# Patient Record
Sex: Male | Born: 1965 | Race: White | Hispanic: No | Marital: Married | Smoking: Never smoker
Health system: Southern US, Community
[De-identification: ages and names within clinical notes are randomized; demographics above are authoritative.]

## PROBLEM LIST (undated history)

## (undated) DIAGNOSIS — K219 Gastro-esophageal reflux disease without esophagitis: Secondary | ICD-10-CM

## (undated) DIAGNOSIS — IMO0001 Reserved for inherently not codable concepts without codable children: Secondary | ICD-10-CM

## (undated) DIAGNOSIS — I10 Essential (primary) hypertension: Secondary | ICD-10-CM

## (undated) HISTORY — DX: Reserved for inherently not codable concepts without codable children: IMO0001

---

## 1987-07-13 HISTORY — PX: MANDIBLE FRACTURE SURGERY: SHX706

## 2003-07-13 HISTORY — PX: HAND SURGERY: SHX662

## 2009-06-23 ENCOUNTER — Encounter: Admission: RE | Admit: 2009-06-23 | Discharge: 2009-06-23 | Payer: Self-pay | Admitting: Family Medicine

## 2009-12-09 ENCOUNTER — Emergency Department (HOSPITAL_COMMUNITY): Admission: EM | Admit: 2009-12-09 | Discharge: 2009-12-10 | Payer: Self-pay | Admitting: Emergency Medicine

## 2010-09-28 LAB — BASIC METABOLIC PANEL
BUN: 20 mg/dL (ref 6–23)
Chloride: 104 mEq/L (ref 96–112)
GFR calc Af Amer: >60 mL/min (ref >60)
Potassium: 3.6 mEq/L (ref 3.5–5.1)

## 2010-09-28 LAB — URINALYSIS, ROUTINE W REFLEX MICROSCOPIC
Bilirubin Urine: NEGATIVE
Glucose, UA: NEGATIVE mg/dL
Nitrite: NEGATIVE
Protein, ur: NEGATIVE mg/dL
Specific Gravity, Urine: 1.021 (ref 1.005–1.030)
pH: 7 (ref 5.0–8.0)

## 2010-09-28 LAB — DIFFERENTIAL
Basophils Absolute: 0 10*3/uL (ref 0.0–0.1)
Basophils Relative: 0 % (ref 0–1)
Eosinophils Absolute: 0.1 10*3/uL (ref 0.0–0.7)
Lymphocytes Relative: 20 % (ref 12–46)
Neutro Abs: 8.5 10*3/uL — ABNORMAL HIGH (ref 1.7–7.7)

## 2010-09-28 LAB — CBC
HCT: 37.8 % — ABNORMAL LOW (ref 39.0–52.0)
MCV: 82.8 fL (ref 78.0–100.0)
RDW: 13.3 % (ref 11.5–15.5)
WBC: 11.8 10*3/uL — ABNORMAL HIGH (ref 4.0–10.5)

## 2012-11-27 ENCOUNTER — Other Ambulatory Visit: Payer: Self-pay | Admitting: Family Medicine

## 2012-11-27 DIAGNOSIS — M545 Low back pain: Secondary | ICD-10-CM

## 2012-11-29 ENCOUNTER — Other Ambulatory Visit: Payer: Self-pay

## 2014-01-02 ENCOUNTER — Encounter: Payer: Self-pay | Admitting: Cardiovascular Disease

## 2014-01-02 ENCOUNTER — Ambulatory Visit (INDEPENDENT_AMBULATORY_CARE_PROVIDER_SITE_OTHER): Payer: BC Managed Care – PPO | Admitting: Cardiovascular Disease

## 2014-01-02 VITALS — BP 130/80 | HR 65 | Ht 64.0 in | Wt 178.0 lb

## 2014-01-02 DIAGNOSIS — R030 Elevated blood-pressure reading, without diagnosis of hypertension: Secondary | ICD-10-CM

## 2014-01-02 DIAGNOSIS — R42 Dizziness and giddiness: Secondary | ICD-10-CM

## 2014-01-02 NOTE — Progress Notes (Signed)
Patient ID: Jason Marquez, male   DOB: Feb 24, 1966, 48 y.o.   MRN: [id number]     PATIENT PROFILE: Jason Marquez is a 48 y.o. male is referred through the courtesy of Dr. Redmond Pulling and [facility] for evaluation of recent blood pressure elevation and dizziness.   HPI:  Jason Marquez is a 48 y.o. male who denies any cardiac history.  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  Several weeks ago, he hit his head and did not require sutures.  There was no evidence for concussion.  Several weeks ago, he began to notice vertigo-like symptoms where he was lightheaded with the room spinning.  This would be triggered by times bending over or turning his head and relieved by staying still.  Apparently, his wife had taken his blood pressure around that time, and it was elevated at 140 100.  The following day he was evaluated at [facility], and his blood pressure was 132/98.  Laboratory reveals a BUN of 16, creatinine 1.3.  Potassium 4.2.  LFTs were normal.  TSH was normal at 1.8 to white blood count was minimally elevated at 11.7.  Hemoglobin 14.5, hematocrit 42.3.  Glucose was 101.  Socially, he has felt well and has been taking Antivert initially at a dose of 25 mg more recently has been taking 12.5 mg.  His wife has checked his blood pressure, these now have been normal in the 130/80 range.  He presents for evaluation.  He denies chest pain.  He denies shortness of breath.  He denies change in activity.  He has been working outside with the extreme heat of the recent weeks.  He has been trying to hydrate himself.  He denies PND, or orthopnea.  He denies episodes where his heart is racing.  History reviewed. No pertinent past medical history.  Past Surgical History  Procedure Laterality Date  . Hand surgery  2005    left  . Mandible fracture surgery  1989    Allergies  Allergen Reactions  . Naproxen     Current Outpatient Prescriptions  Medication  Sig Dispense Refill  . meclizine (ANTIVERT) 25 MG tablet Take 25 mg by mouth as needed.       No current facility-administered medications for this visit.    Socially, he is married for 25 years.  He works as a Engineer, building services for [employer].  There is no tobacco or alcohol use.  He does walk 3 days per week 30 minutes at a time.  He has 3 children and one grandchild.  Family History  Problem Relation Age of Onset  . Diabetes Mother   . Diabetes Maternal Grandmother   . Diabetes Maternal Grandfather   . Cancer Paternal Grandmother     ROS General: Negative; No fevers, chills, or night sweats HEENT: Negative; No changes in vision or hearing, sinus congestion, difficulty swallowing Pulmonary: Negative; No cough, wheezing, shortness of breath, hemoptysis Cardiovascular:  See HPI;  GI: Negative; No nausea, vomiting, diarrhea, or abdominal pain GU: Negative; No dysuria, hematuria, or difficulty voiding Musculoskeletal: Negative; no myalgias, joint pain, or weakness Hematologic/Oncologic: Negative; no easy bruising, bleeding Endocrine: Negative; no heat/cold intolerance; no diabetes Neuro: Negative; no changes in balance, headaches Skin: Negative; No rashes or skin lesions; He was  bitten by a tick over a month ago.  He denies any associated rash. Psychiatric: Negative; No behavioral problems, depression Sleep: Negative; No daytime sleepiness, hypersomnolence, bruxism, restless  legs, hypnogagnic hallucinations Other comprehensive 14 point system review is negative   Physical Exam BP 130/80  Pulse 65  Ht 5' 4"  (1.626 m)  Wt 178 lb (80.74 kg)  BMI 30.54 kg/m2 Repeat blood pressure by me was 128/86 supine and was 130/84 standing.  There was no orthostatic pulse rise. General: Alert, oriented, no distress.  Skin: normal turgor, no rashes, warm and dry HEENT: Normocephalic, atraumatic. Pupils equal round and reactive to light; sclera anicteric; extraocular muscles intact; Fundi  normal Nose without nasal septal hypertrophy Mouth/Parynx benign; Mallinpatti scale 2 Neck: No JVD, no carotid bruits; normal carotid upstroke Lungs: clear to ausculatation and percussion; no wheezing or rales Chest wall: without tenderness to palpitation Heart: PMI not displaced, RRR, s1 s2 normal, 1/6 systolic murmur, no diastolic murmur, no rubs, gallops, thrills, or heaves Abdomen: soft, nontender; no hepatosplenomehaly, BS+; abdominal aorta nontender and not dilated by palpation. Back: no CVA tenderness Pulses 2+ Musculoskeletal: full range of motion, normal strength, no joint deformities Extremities: no clubbing cyanosis or edema, Homan's sign negative  Neurologic: grossly nonfocal; Cranial nerves grossly wnl Psychologic: Normal mood and affect   ECG (independently read by me): Normal sinus rhythm at 65 beats per minute.  Borderline criteria for possible LVH in lead aVL.  Mild repolarization changes.  No ectopy.  Normal intervals.  LABS:  BMET    Component Value Date/Time   NA 136 12/10/2009 0107   K 3.6 12/10/2009 0107   CL 104 12/10/2009 0107   CO2 25 12/10/2009 0107   GLUCOSE 101* 12/10/2009 0107   BUN 20 12/10/2009 0107   CREATININE 1.02 12/10/2009 0107   CALCIUM 8.1* 12/10/2009 0107   GFRNONAA >60 12/10/2009 0107   GFRAA  Value: >60        The eGFR has been calculated using the MDRD equation. This calculation has not been validated in all clinical situations. eGFR's persistently <60 mL/min signify possible Chronic Kidney Disease. 12/10/2009 0107     Hepatic Function Panel  No results found for this basename: prot, albumin, ast, alt, alkphos, bilitot, bilidir, ibili     CBC    Component Value Date/Time   WBC 11.8* 12/10/2009 0107   RBC 4.56 12/10/2009 0107   HGB 13.1 12/10/2009 0107   HCT 37.8* 12/10/2009 0107   PLT 142* 12/10/2009 0107   MCV 82.8 12/10/2009 0107   MCHC 34.6 12/10/2009 0107   RDW 13.3 12/10/2009 0107   LYMPHSABS 2.4 12/10/2009 0107   MONOABS 0.8 12/10/2009 0107   EOSABS 0.1  12/10/2009 0107   BASOSABS 0.0 12/10/2009 0107     BNP No results found for this basename: probnp    Lipid Panel  No results found for this basename: chol, trig, hdl, cholhdl, vldl, ldlcalc      RADIOLOGY: No results found.   ASSESSMENT AND PLAN: Mr. Jamir Rone is a 48 year old gentleman who several weeks ago, was evaluated and was found to be mildly hypertensive.  The patient at that time was experiencing vertigo-like symptoms.  He denies any episodes of tachycardia.  He did experience dizziness with movement of his head and also had some associated nausea.  These symptoms have significantly improved since initiating Antivert.  His blood pressure today is stable and he does not have any findings to suggest orthostatic hypotension.  I suspect, is recent mild diastolic blood pressure elevation noted.  Several weeks ago, may be related to a combination of being dehydrated to the extreme heat of approximately 100 while working outside in  having these vertigo symptoms.  He does have borderline voltage criteria for LVH on his ECG in lead aVL.  I am scheduling him for a 2-D echo Doppler study to evaluate systolic and diastolic function, as well as for left ventricular hypertrophy, and valvular architecture.  I discussed with his wife and the plan to continue blood pressure recordings at home.  Presently, his blood pressure is stable.  I did review his blood work done at [facility].  At present, I do not feel he needs institution of blood pressure lowering medication.  However, she will be checking his blood pressure regularly at home and if his systolic blood pressure is 140 or above and diastolic blood pressure 90 or above.  I recommended a followup evaluation.  We will contact him  regarding his echo Doppler studies.  I will be available on an as needed basis pending upon his blood pressure recordings and echo Doppler.  Findings.  We discussed the importance of staying well hydrated particularly during  the heat of the summer and his outdoor physical work.   Troy Sine, MD, [facility] 01/02/2014 9:25 AM

## 2014-01-02 NOTE — Patient Instructions (Signed)
Your physician has requested that you have an echocardiogram. Echocardiography is a painless test that uses sound waves to create images of your heart. It provides your doctor with information about the size and shape of your heart and how well your heart's chambers and valves are working. This procedure takes approximately one hour. There are no restrictions for this procedure.  We will call you with the results.  Your physician recommends that you schedule a follow-up appointment in: AS NEEDED

## 2014-01-09 ENCOUNTER — Ambulatory Visit (HOSPITAL_COMMUNITY)
Admission: RE | Admit: 2014-01-09 | Discharge: 2014-01-09 | Disposition: A | Discharge status: Home / Self Care | Payer: BC Managed Care – PPO | Source: Ambulatory Visit | Attending: Cardiovascular Disease | Admitting: Cardiovascular Disease

## 2014-01-09 DIAGNOSIS — R42 Dizziness and giddiness: Secondary | ICD-10-CM

## 2014-01-09 DIAGNOSIS — I359 Nonrheumatic aortic valve disorder, unspecified: Secondary | ICD-10-CM | POA: Insufficient documentation

## 2014-01-09 NOTE — Progress Notes (Signed)
2D Echo Performed 01/09/2014    Keyanna Sandefer, RCS  

## 2014-08-13 ENCOUNTER — Other Ambulatory Visit (HOSPITAL_COMMUNITY): Payer: Self-pay | Admitting: Family Medicine

## 2014-08-13 DIAGNOSIS — R42 Dizziness and giddiness: Secondary | ICD-10-CM

## 2014-08-26 ENCOUNTER — Other Ambulatory Visit: Payer: Self-pay

## 2014-09-03 ENCOUNTER — Ambulatory Visit
Admission: RE | Admit: 2014-09-03 | Discharge: 2014-09-03 | Disposition: A | Discharge status: Home / Self Care | Payer: BLUE CROSS/BLUE SHIELD | Source: Ambulatory Visit | Attending: Family Medicine | Admitting: Family Medicine

## 2014-09-03 ENCOUNTER — Other Ambulatory Visit (HOSPITAL_COMMUNITY): Payer: Self-pay | Admitting: Family Medicine

## 2014-09-03 DIAGNOSIS — R42 Dizziness and giddiness: Secondary | ICD-10-CM

## 2014-09-12 ENCOUNTER — Ambulatory Visit
Admission: RE | Admit: 2014-09-12 | Discharge: 2014-09-12 | Disposition: A | Discharge status: Home / Self Care | Payer: BLUE CROSS/BLUE SHIELD | Source: Ambulatory Visit | Attending: Family Medicine | Admitting: Family Medicine

## 2014-09-12 ENCOUNTER — Other Ambulatory Visit: Payer: Self-pay

## 2014-09-12 ENCOUNTER — Other Ambulatory Visit: Payer: Self-pay | Admitting: Family Medicine

## 2014-09-12 DIAGNOSIS — R42 Dizziness and giddiness: Secondary | ICD-10-CM

## 2014-09-12 DIAGNOSIS — R1011 Right upper quadrant pain: Secondary | ICD-10-CM

## 2014-09-17 ENCOUNTER — Ambulatory Visit
Admission: RE | Admit: 2014-09-17 | Discharge: 2014-09-17 | Disposition: A | Discharge status: Home / Self Care | Payer: BLUE CROSS/BLUE SHIELD | Source: Ambulatory Visit | Attending: Family Medicine | Admitting: Family Medicine

## 2014-09-17 DIAGNOSIS — R1011 Right upper quadrant pain: Secondary | ICD-10-CM

## 2014-09-30 ENCOUNTER — Encounter: Payer: Self-pay | Admitting: Diagnostic Neuroimaging

## 2014-09-30 ENCOUNTER — Ambulatory Visit (INDEPENDENT_AMBULATORY_CARE_PROVIDER_SITE_OTHER): Payer: BLUE CROSS/BLUE SHIELD | Admitting: Diagnostic Neuroimaging

## 2014-09-30 VITALS — BP 134/92 | HR 70 | Ht 64.0 in | Wt 185.2 lb

## 2014-09-30 DIAGNOSIS — R42 Dizziness and giddiness: Secondary | ICD-10-CM

## 2014-09-30 NOTE — Progress Notes (Signed)
[facility]  PATIENT: Jason Marquez DOB: 1965-09-19  REFERRING CLINICIAN: Kelby Marquez HISTORY FROM: patient  REASON FOR VISIT: new consult    HISTORICAL  CHIEF COMPLAINT:  Chief Complaint  Patient presents with  . New Evaluation    had a head injury about a year ago, been experiencing dizziness a few times a week    HISTORY OF PRESENT ILLNESS:   49 year old male here for evaluation of dizziness. June 2015 patient was working under some machinery, when he stood up and hit his head against a metal door. He had a small laceration on his scalp which was treated with Steri-Strips. No significant headache or dizziness at the time of injury. One week later he was bending down under some equipment, then had onset of spinning sensation. Symptoms lasted 10-20 seconds. No nausea or vomiting. No ringing in ears or hearing loss. Since that time symptoms have improved, but are persistent. He is having 2-3 episodes per week, sometimes 2 per day, similar spinning, movement, delayed sensation if he moves quickly. Patient had MRI of the brain which showed nonspecific T2 hyperintensities. Patient referred to me for further evaluation.  Patient has some mild intermittent headaches throughout his life. He has mild hypercholesterolemia which is being managed with diet and exercise. He smokes cigarettes recently when he was a teenager. Patient exercises at home 2-3 times per week.  Patient had a concussion, 20 years ago, where he stood up and hit his head on an object above. At that time he had significant dizziness, confusion, nausea, and was diagnosed with concussion.   REVIEW OF SYSTEMS: Full 14 system review of systems performed and notable only for headache dizziness decreased energy.  ALLERGIES: Allergies  Allergen Reactions  . Naproxen     HOME MEDICATIONS: Outpatient Prescriptions Prior to Visit  Medication Sig Dispense Refill  . meclizine (ANTIVERT) 25 MG tablet Take 25 mg  by mouth as needed.     No facility-administered medications prior to visit.    PAST MEDICAL HISTORY: Past Medical History  Diagnosis Date  . Healthy adult     PAST SURGICAL HISTORY: Past Surgical History  Procedure Laterality Date  . Hand surgery  2005    left  . Mandible fracture surgery  1989    FAMILY HISTORY: Family History  Problem Relation Age of Onset  . Diabetes Mother   . Diabetes Maternal Grandmother   . Diabetes Maternal Grandfather   . Cancer Paternal Grandmother     SOCIAL HISTORY:  History   Social History  . Marital Status: Married    Spouse Name: Jason Marquez  . Number of Children: 2  . Years of Education: 12   Occupational History  . Not on file.   Social History Main Topics  . Smoking status: Never Smoker   . Smokeless tobacco: Not on file  . Alcohol Use: Yes     Comment: occas.  . Drug Use: No  . Sexual Activity: Not on file   Other Topics Concern  . Not on file   Social History Narrative   Lives with wife and 2 sons   Right handed    Drinks caffeine: once in a while, mostly tea      PHYSICAL EXAM  Filed Vitals:   09/30/14 0906  BP: 134/92  Pulse: 70  Height: 5\' 4"  (1.626 m)  Weight: 185 lb 3.2 oz (84.006 kg)    Body mass index is 31.77 kg/(m^2).   Visual Acuity Screening  Right eye Left eye Both eyes  Without correction: 20/50 20/30   With correction:       No flowsheet data found.  GENERAL EXAM: Patient is in no distress; well developed, nourished and groomed; neck is supple; DIX HALLPIKE AT SLOW SPEED, NEGATIVE. WITH FASTER MOVEMENTS, PT FEELS SUBJECTIVE DELAY IN MOVEMENT SENSATION VS ACTUAL BODY MOVEMENT. NO NYSTAGMUS OR NAUSEA.  CARDIOVASCULAR: Regular rate and rhythm, no murmurs, no carotid bruits  NEUROLOGIC: MENTAL STATUS: awake, alert, oriented to person, place and time, recent and remote memory intact, normal attention and concentration, language fluent, comprehension intact, naming intact, fund of  knowledge appropriate CRANIAL NERVE: no papilledema on fundoscopic exam, pupils equal and reactive to light, visual fields full to confrontation, extraocular muscles intact, no nystagmus, facial sensation and strength symmetric, hearing intact, palate elevates symmetrically, uvula midline, shoulder shrug symmetric, tongue midline. MOTOR: normal bulk and tone, full strength in the BUE, BLE SENSORY: normal and symmetric to light touch, pinprick, temperature, vibration  COORDINATION: finger-nose-finger, fine finger movements normal REFLEXES: deep tendon reflexes present and symmetric GAIT/STATION: narrow based gait; able to walk on toes, heels and tandem; romberg is negative    DIAGNOSTIC DATA (LABS, IMAGING, TESTING) - I reviewed patient records, labs, notes, testing and imaging myself where available.  Lab Results  Component Value Date   WBC 11.8* 12/10/2009   HGB 13.1 12/10/2009   HCT 37.8* 12/10/2009   MCV 82.8 12/10/2009   PLT 142* 12/10/2009      Component Value Date/Time   NA 136 12/10/2009 0107   K 3.6 12/10/2009 0107   CL 104 12/10/2009 0107   CO2 25 12/10/2009 0107   GLUCOSE 101* 12/10/2009 0107   BUN 20 12/10/2009 0107   CREATININE 1.02 12/10/2009 0107   CALCIUM 8.1* 12/10/2009 0107   GFRNONAA >60 12/10/2009 0107   GFRAA  12/10/2009 0107    >60        The eGFR has been calculated using the MDRD equation. This calculation has not been validated in all clinical situations. eGFR's persistently <60 mL/min signify possible Chronic Kidney Disease.   No results found for: CHOL, HDL, LDLCALC, LDLDIRECT, TRIG, CHOLHDL No results found for: HGBA1C No results found for: VITAMINB12 No results found for: TSH   09/12/14 MRI brain - Mild subcortical and periventricular T2 and FLAIR hyperintensities, likely chronic microvascular ischemic change. No acute intracranial findings or posttraumatic sequelae. [I reviewed images myself. 2 small subcortical foci of non-specific gliosis,  not likely related to pts symptoms; may be due to chronic small vessel ischemic disease as pt has mild hypercholesterolemia, int headaches, possible migraine, and remote smoking. -VRP]    ASSESSMENT AND PLAN  49 y.o. year old male here with intermittent dizziness / vertigo sensation triggered by fast head/body movements, since mild head injury in June 2015. Symptoms persistent, but slightly better than at onset. Will try vestibular PT for evaluation. MRI shows non-specific findings and I recommend observation.   Dx: peripheral vestibulopathy / post-concussion syndrome  PLAN: - try vestibular PT - monitor symptoms - reviewed importance of nutrition, hydration, exercise  Orders Placed This Encounter  Procedures  . PT vestibular rehab    Return in about 6 months (around 04/02/2015), or if symptoms worsen or fail to improve.    Penni Bombard, MD 3/70/4888, 9:16 AM Certified in Neurology, Neurophysiology and Neuroimaging  [facility] [address] [place], [place] [address] [phone]

## 2014-09-30 NOTE — Patient Instructions (Signed)
Try vestibular physical therapy evaluation.

## 2016-02-08 IMAGING — CR DG ORBITS FOR FOREIGN BODY
2 series · 2 of 2 positions shown · non-contrast
Comparison: None.

CLINICAL DATA: Metal working/exposure; clearance prior to MRI

EXAM:
ORBITS FOR FOREIGN BODY - 2 VIEW

[w waters (1 of 2)]
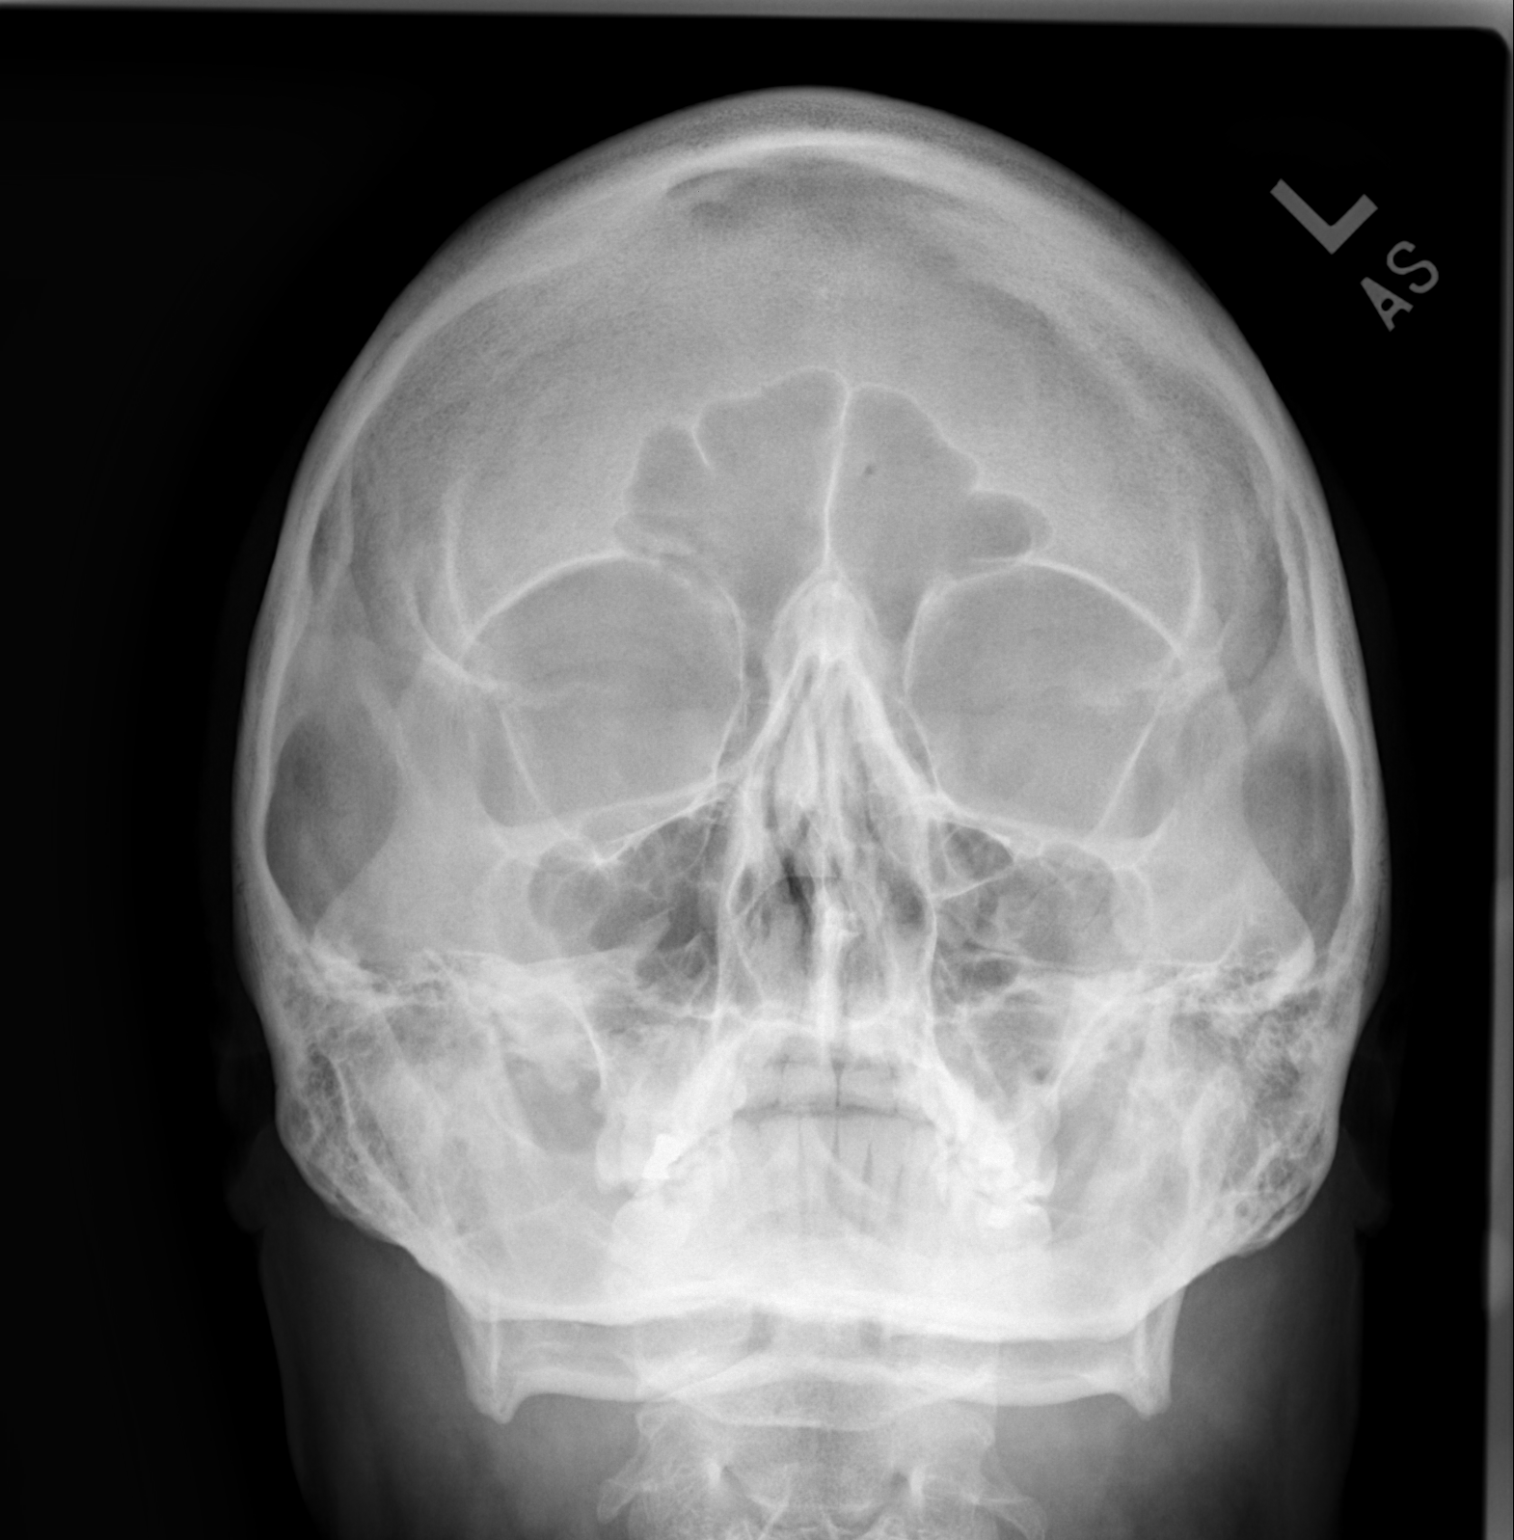

[w waters (2 of 2)]
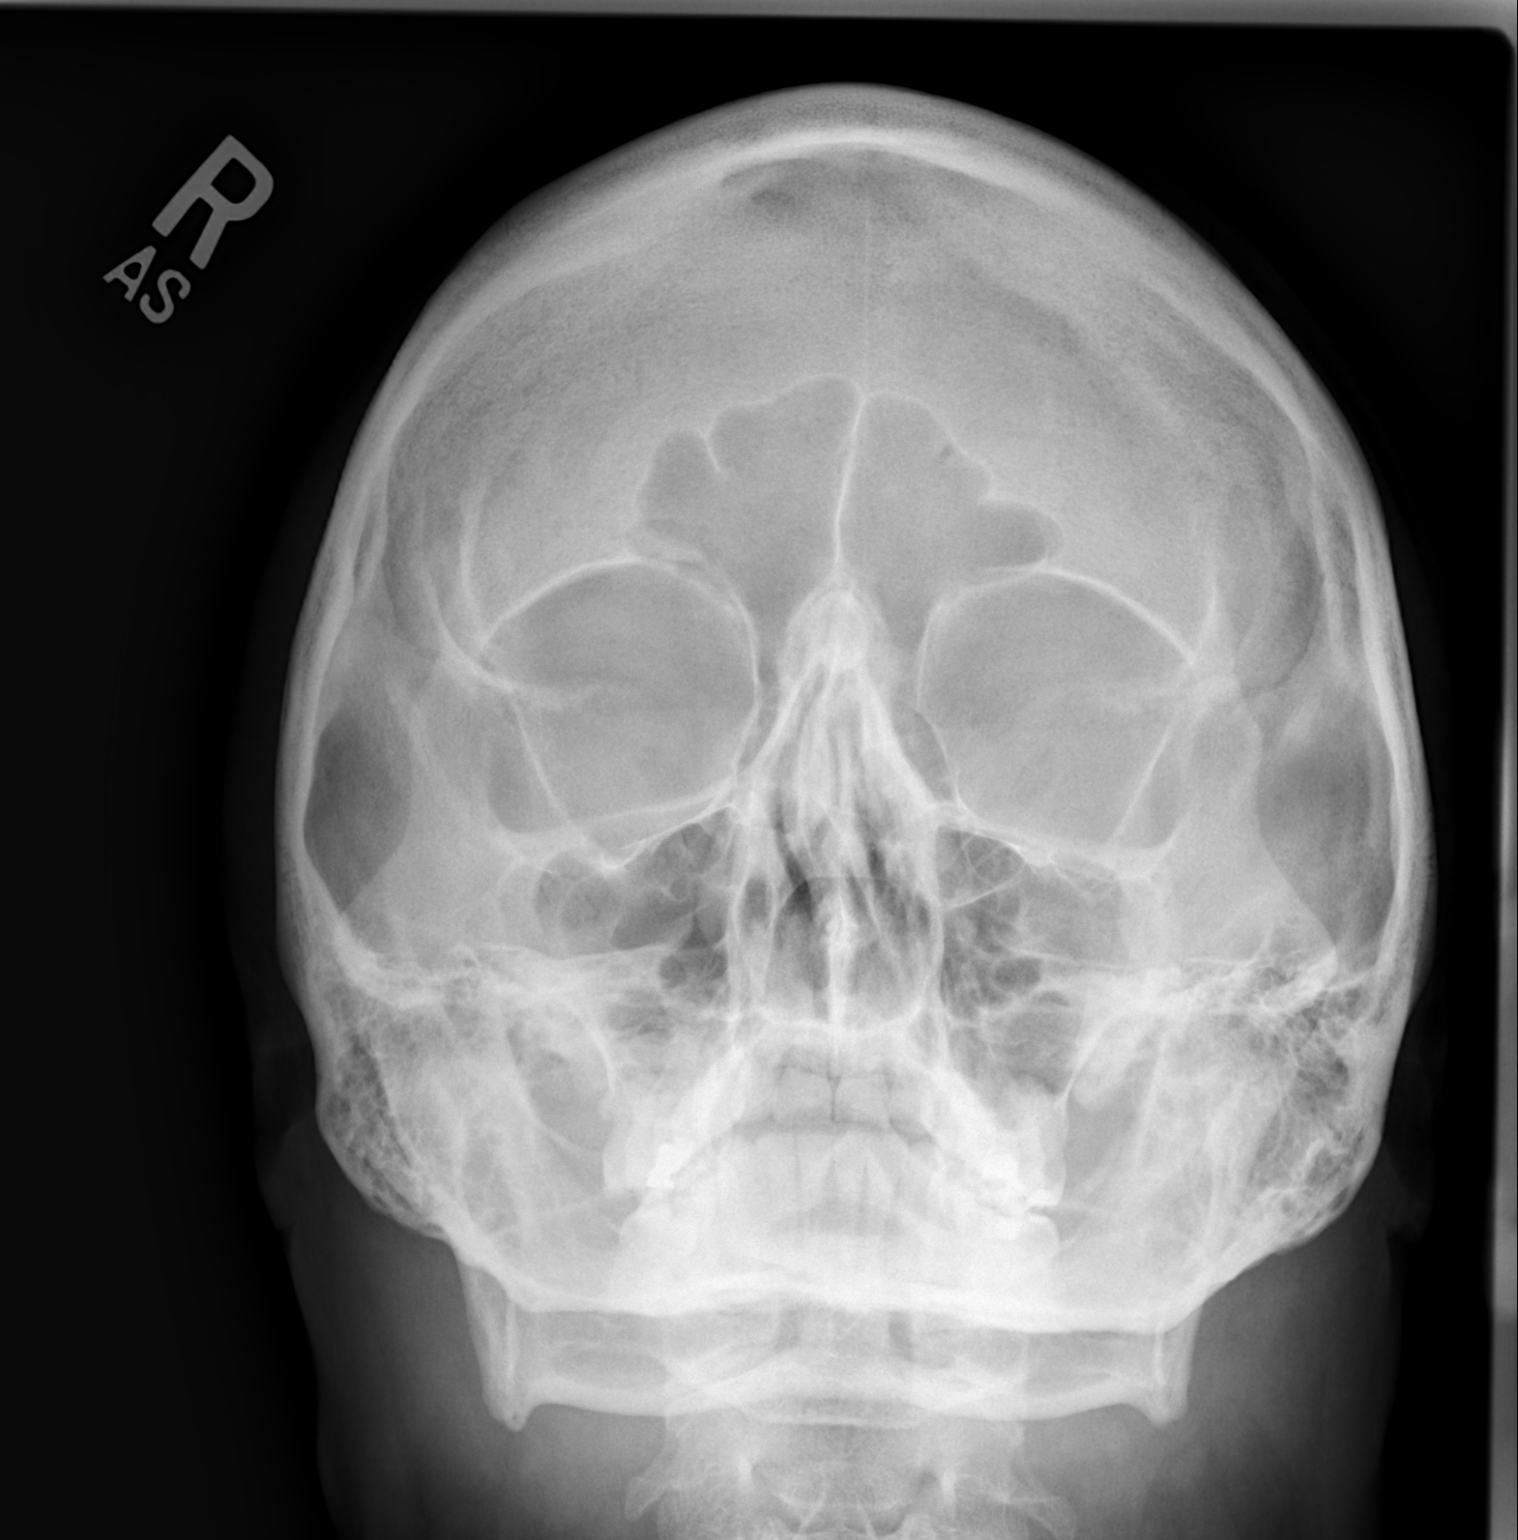

[2 of 2 positions shown; findings below may reference images not displayed]

FINDINGS: Water's views with eyes deviated toward the left and toward the
right were obtained. There is no radiopaque foreign body within the
orbits. No fracture or dislocation. Paranasal sinuses are clear.
IMPRESSION: No evidence of metallic foreign body within the orbits.

## 2016-02-08 IMAGING — US US CAROTID DUPLEX BILAT
1 series · 13 of 24 positions shown · non-contrast
Comparison: None.

CLINICAL DATA: Dizziness

EXAM:
BILATERAL CAROTID DUPLEX ULTRASOUND
TECHNIQUE: Gray scale imaging, color Doppler and duplex ultrasound were
performed of bilateral carotid and vertebral arteries in the neck.

[Series 1: us carotid duplex bilat · 0.07mm/px · 13 of 54 slices shown]
[im 1/54]
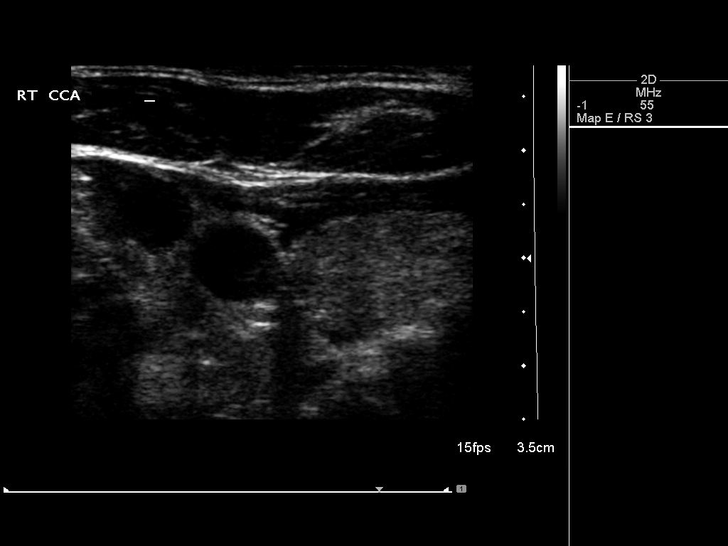
[im 5/54]
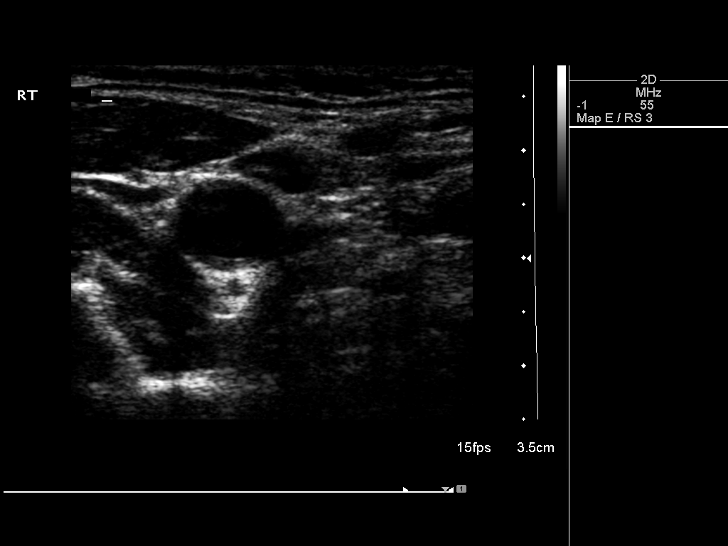
[im 10/54]
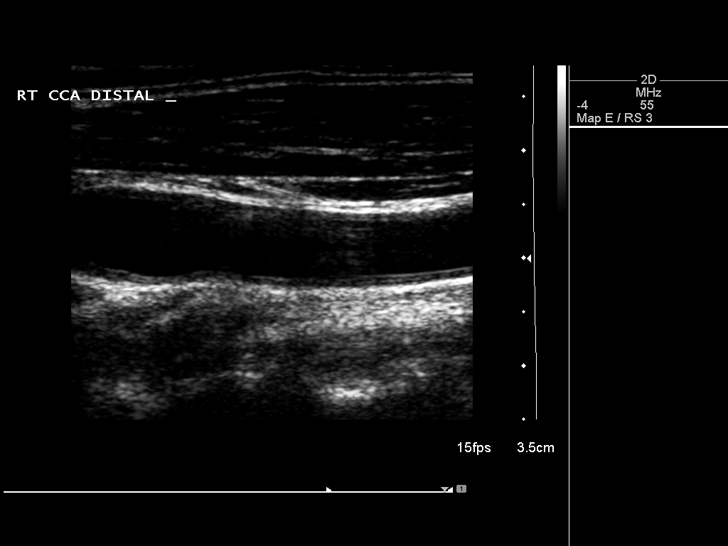
[im 14/54]
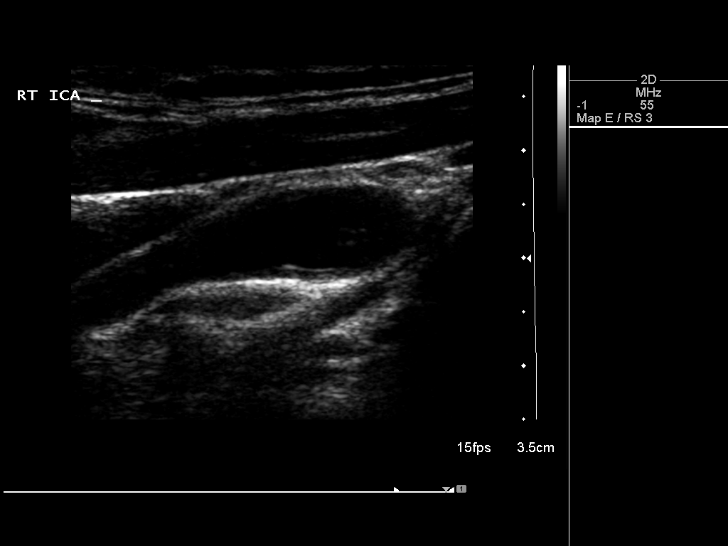
[im 19/54]
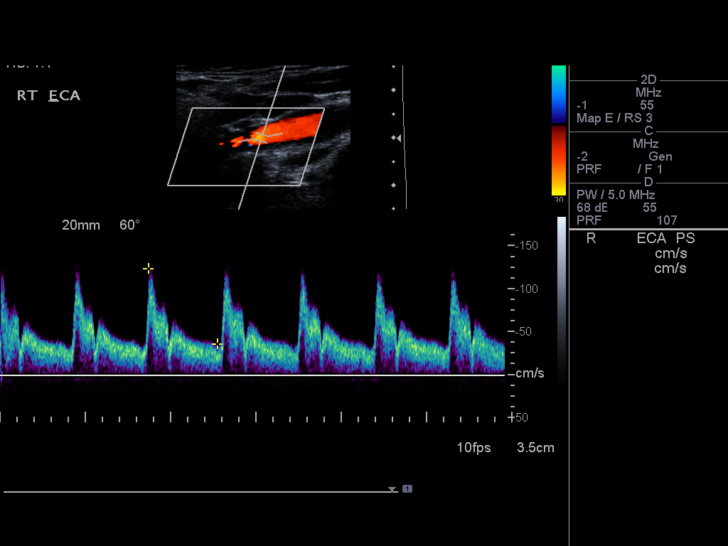
[im 24/54]
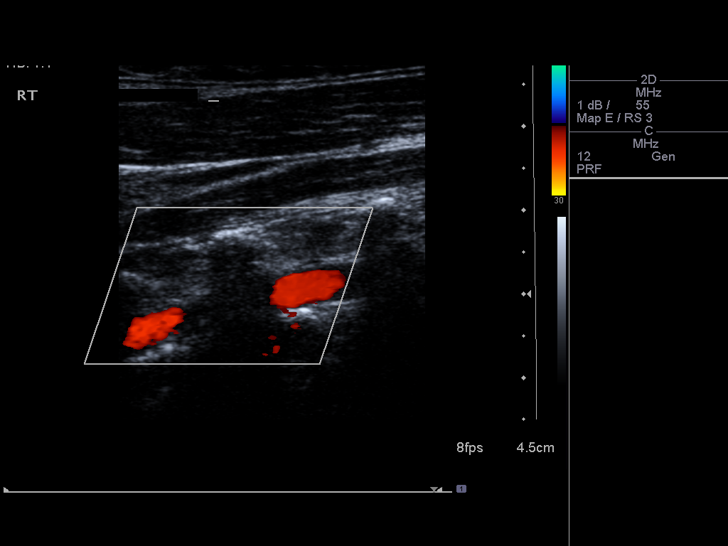
[im 28/54]
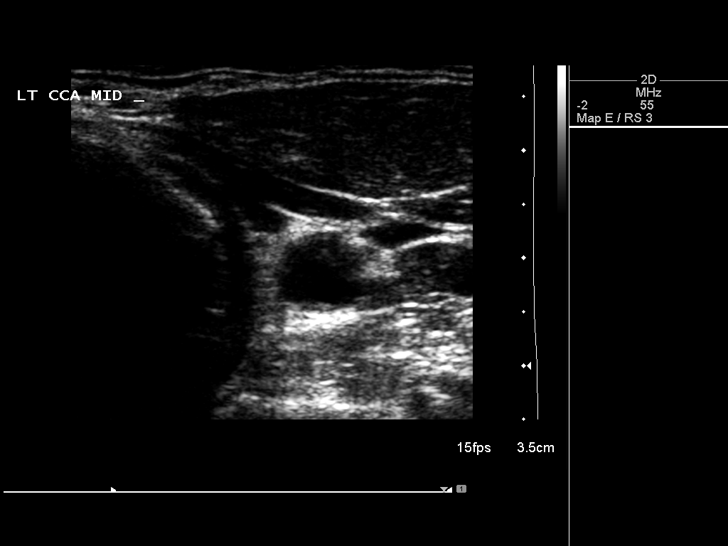
[im 30/54]
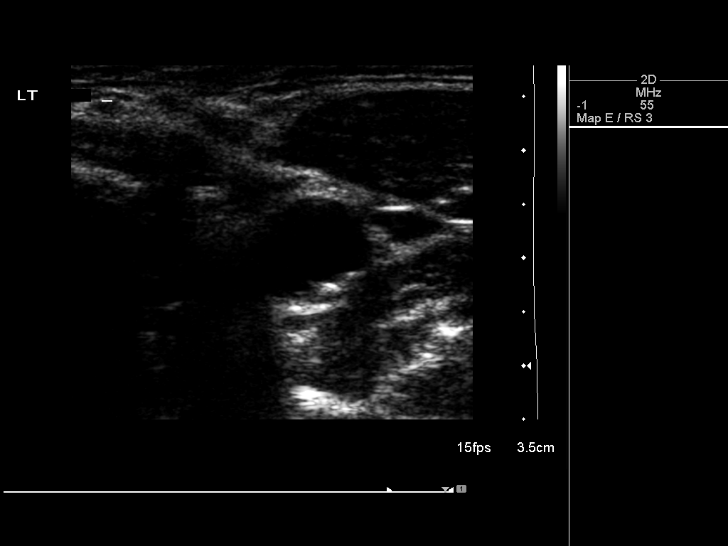
[im 35/54]
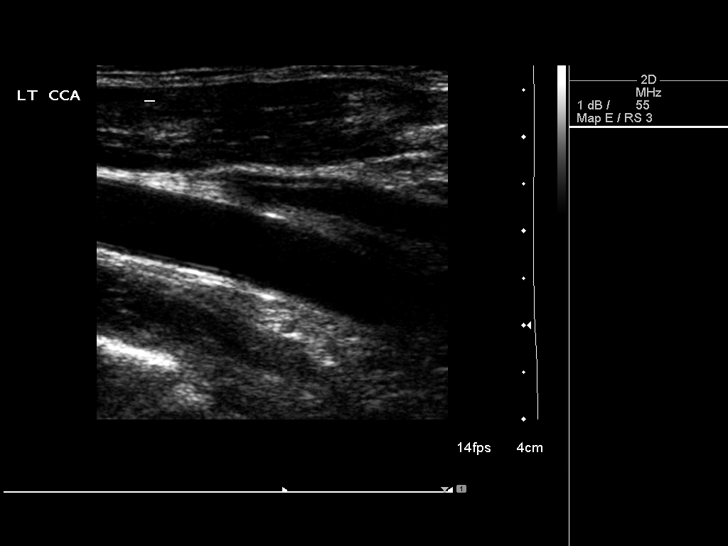
[im 40/54]
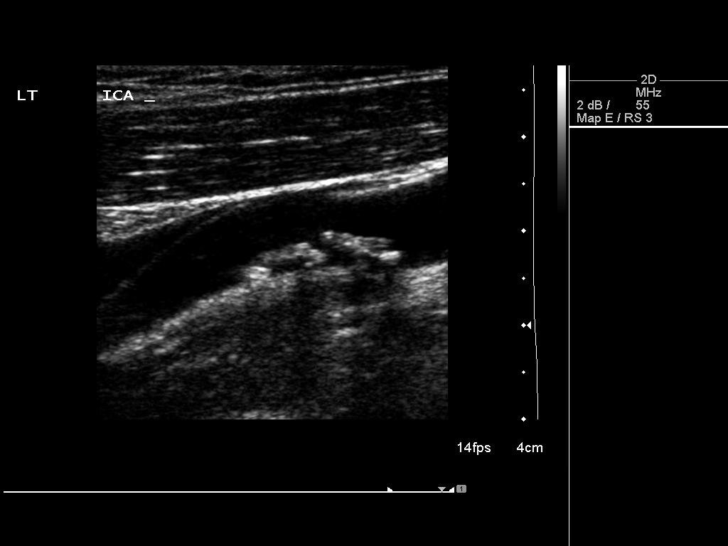
[im 44/54]
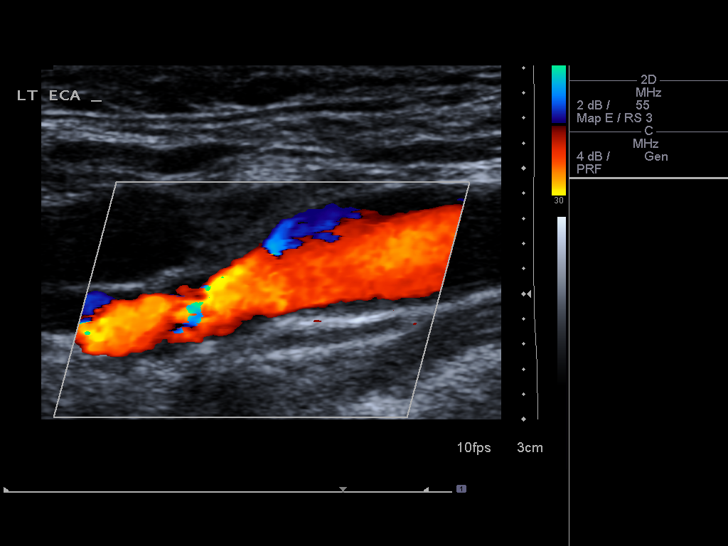
[im 49/54]
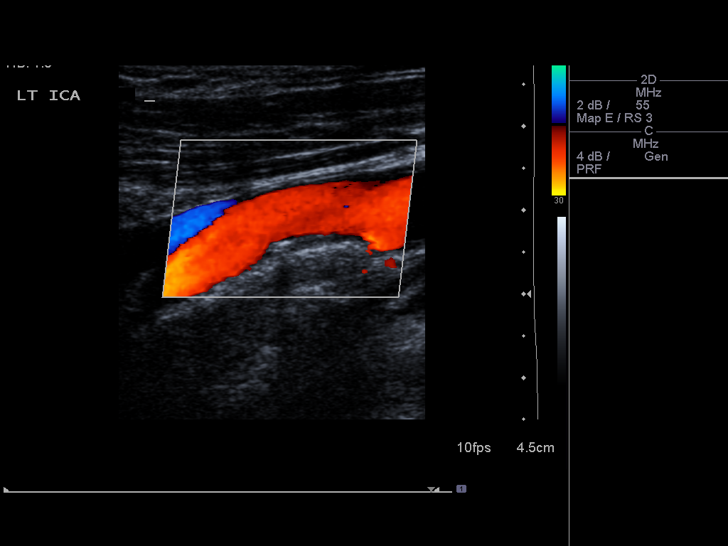
[im 54/54]
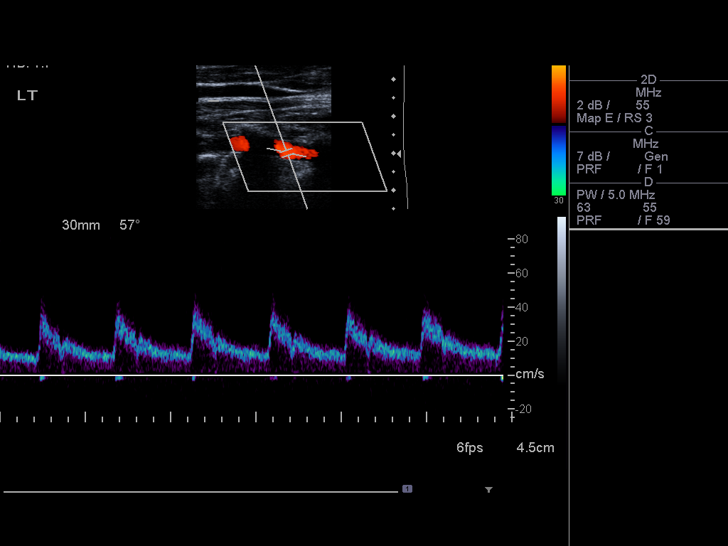

[13 of 24 positions shown; findings below may reference images not displayed]

FINDINGS: Criteria: Quantification of carotid stenosis is based on velocity
parameters that correlate the residual internal carotid diameter
with NASCET-based stenosis levels, using the diameter of the distal
internal carotid lumen as the denominator for stenosis measurement.

The following velocity measurements were obtained:

RIGHT

ICA:  81 cm/sec

CCA:  113 cm/sec

SYSTOLIC ICA/CCA RATIO:

DIASTOLIC ICA/CCA RATIO:

ECA:  124 cm/sec

LEFT

ICA:  88 cm/sec

CCA:  115 cm/sec

SYSTOLIC ICA/CCA RATIO:

DIASTOLIC ICA/CCA RATIO:

ECA:  112 cm/sec

RIGHT CAROTID ARTERY: Minimal intimal thickening in the bulb. Low
resistance internal carotid Doppler pattern.

RIGHT VERTEBRAL ARTERY:  Antegrade with a normal Doppler pattern.

LEFT CAROTID ARTERY: There is focal calcified plaque in the bulb
encroaching upon the lumen of the bulb. Low resistance internal
carotid Doppler pattern.

LEFT VERTEBRAL ARTERY:  Antegrade with a normal Doppler pattern.
IMPRESSION: Less than 50% stenosis in the right and left internal carotid
arteries. There is calcified plaque in the left bulb encroaching
upon the lumen.

## 2016-02-17 IMAGING — MR MR HEAD W/O CM
8 of 9 series · 33 of 48 positions shown · non-contrast
Comparison: None.

CLINICAL DATA: Dizziness beginning 3 months ago after blunt head
trauma. No loss of consciousness. Initial encounter.

EXAM:
MRI HEAD WITHOUT CONTRAST
TECHNIQUE: Multiplanar, multiecho pulse sequences of the brain and surrounding
structures were obtained without intravenous contrast.

[Series 2: T1 · sagittal · 5.0mm · 0.45mm/px · 2 of 19 slices shown]
[im 1/19]
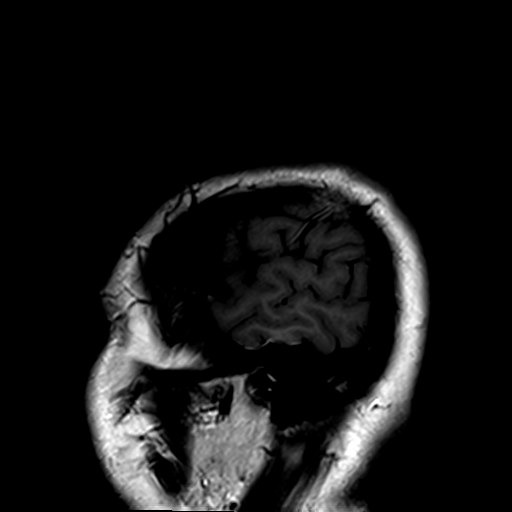
[im 19/19]
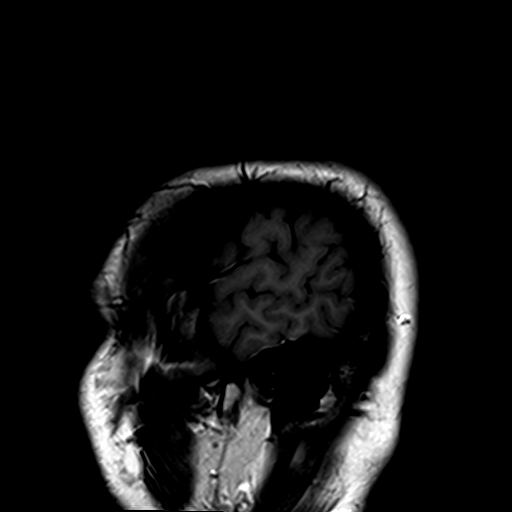

[Series 3: T2 · axial · 5.0mm · 0.45mm/px · z∈[-56,+78]mm · 3 of 22 slices shown (1 of 2)]
[im 1/22]
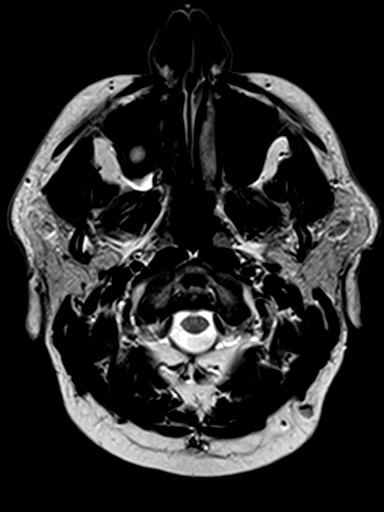
[im 11/22]
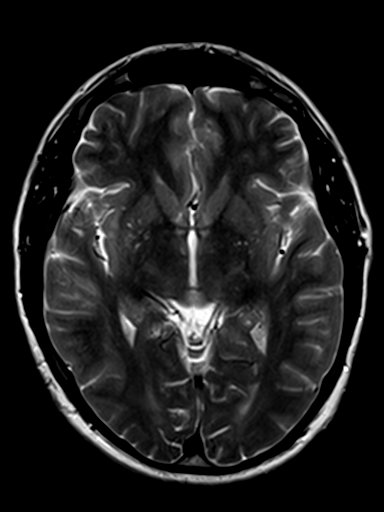
[im 22/22]
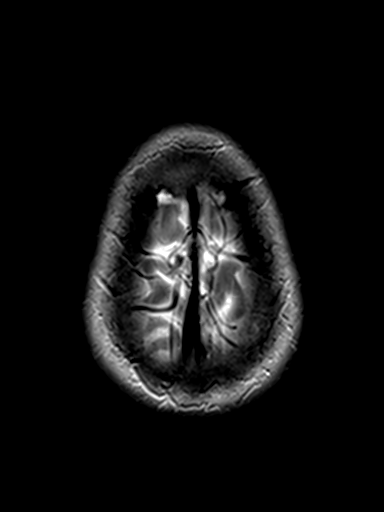

[Series 4: DWI · axial · 3.0mm · 0.94mm/px · z∈[-57,+81]mm · 11 of 96 slices shown]
[im 1/96]
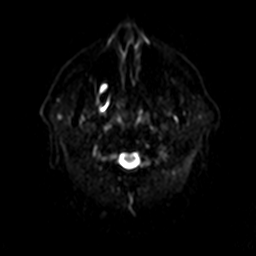
[im 10/96]
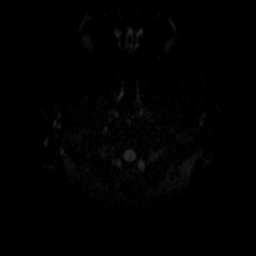
[im 20/96]
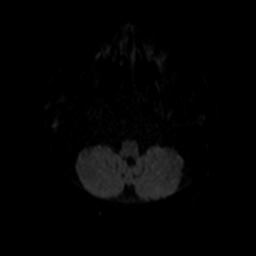
[im 29/96]
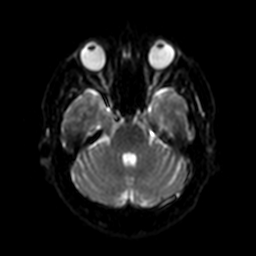
[im 39/96]
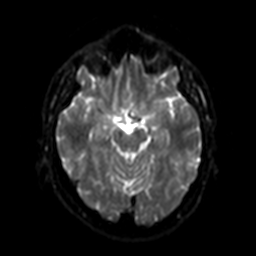
[im 48/96]
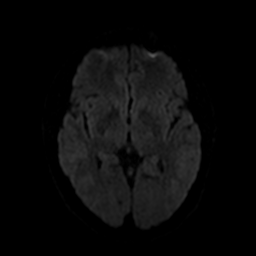
[im 58/96]
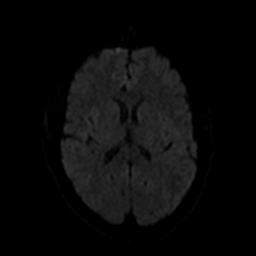
[im 67/96]
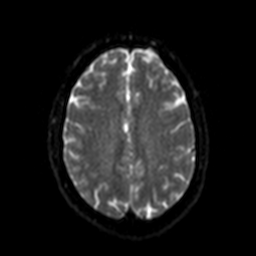
[im 77/96]
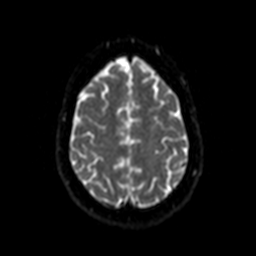
[im 86/96]
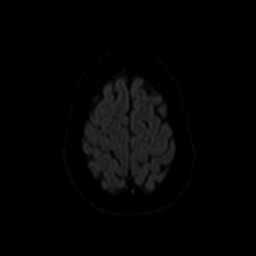
[im 96/96]
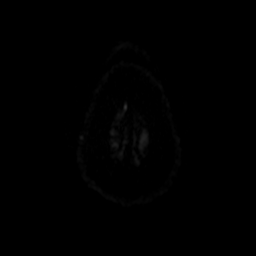

[Series 5: dwi_adc · axial · 3.0mm · 0.94mm/px · z∈[-57,+81]mm · 6 of 48 slices shown]
[im 1/48]
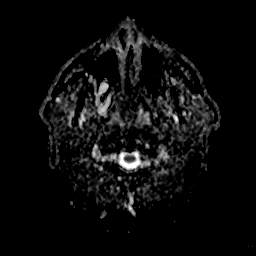
[im 10/48]
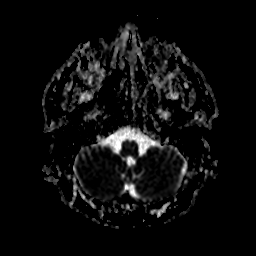
[im 19/48]
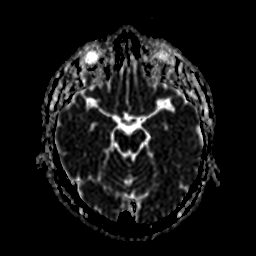
[im 29/48]
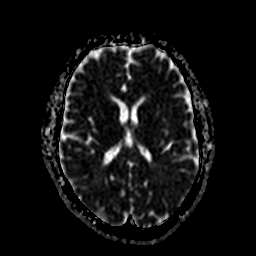
[im 38/48]
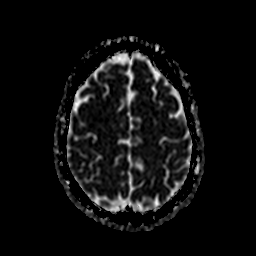
[im 48/48]
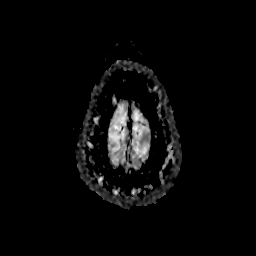

[Series 6: FLAIR · axial · 5.0mm · 0.45mm/px · z∈[-56,+78]mm · 3 of 22 slices shown (1 of 2)]
[im 1/22]
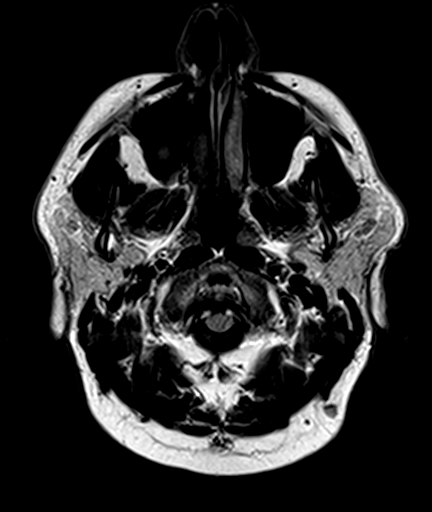
[im 11/22]
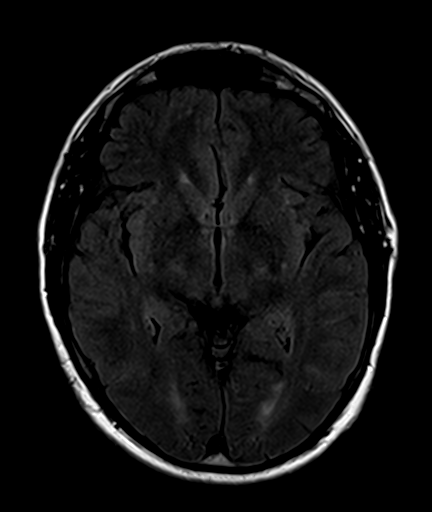
[im 22/22]
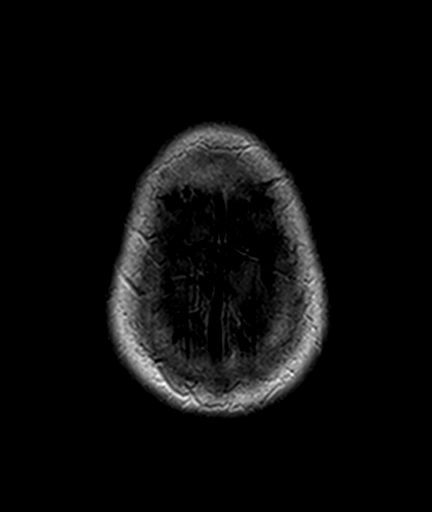

[Series 7: axial (person_name)1 volume · axial · 2.0mm · 0.45mm/px · z∈[-63,-26]mm · 3 of 80 slices shown]
[im 1/80]
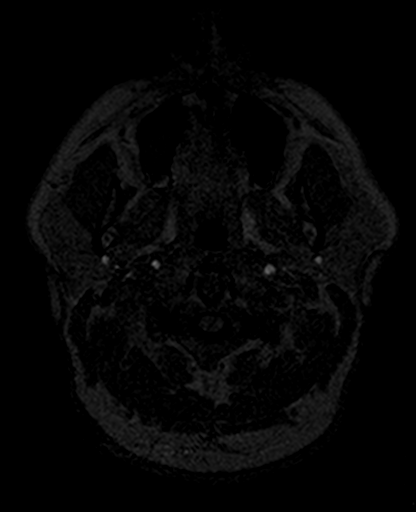
[im 10/80]
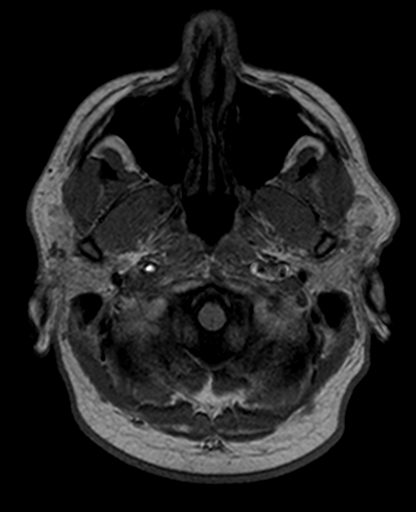
[im 20/80]
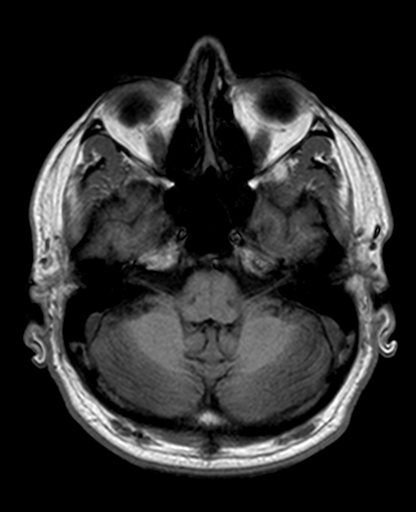

[Series 10: T2 · coronal · 5.0mm · 0.45mm/px · 3 of 26 slices shown (2 of 2)]
[im 1/26]
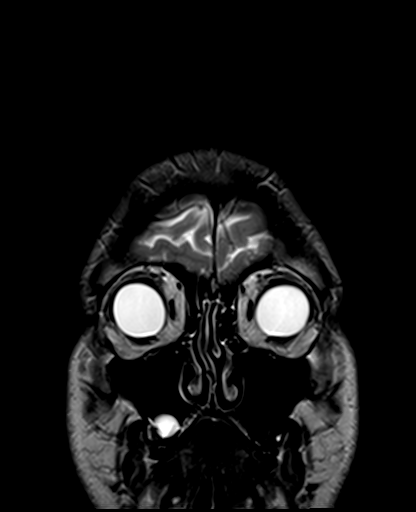
[im 13/26]
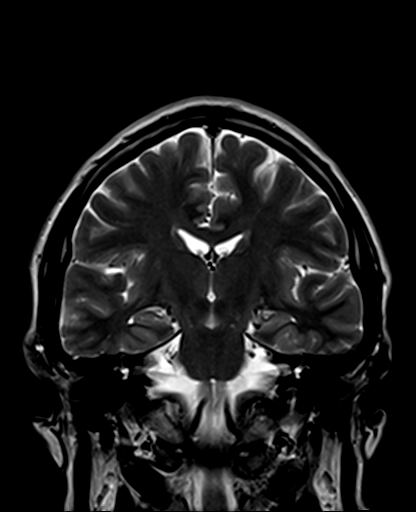
[im 26/26]
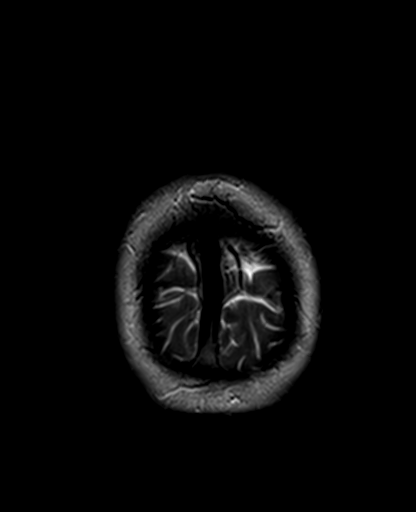

[Series 11: FLAIR · sagittal · 5.0mm · 0.45mm/px · 2 of 19 slices shown (2 of 2)]
[im 1/19]
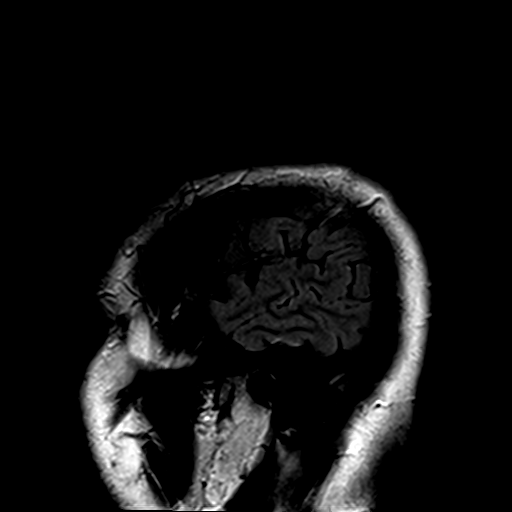
[im 19/19]
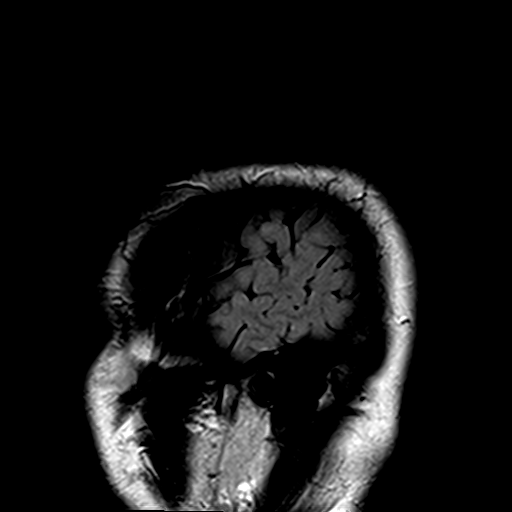

[33 of 48 positions shown; findings below may reference images not displayed]

FINDINGS: No evidence for acute infarction, hemorrhage, mass lesion,
hydrocephalus, or extra-axial fluid. Normal cerebral volume. Mild
subcortical and periventricular T2 and FLAIR hyperintensities,
likely chronic microvascular ischemic change.

Flow voids are maintained throughout the carotid, basilar, and
vertebral arteries. There are no areas of chronic hemorrhage.
Pituitary, pineal, and cerebellar tonsils unremarkable. No upper
cervical lesions.

Visualized calvarium, skull base, and upper cervical osseous
structures unremarkable. Scalp and extracranial soft tissues,
orbits, sinuses, and mastoids show no acute process.

No visible scalp hematoma or calvarial deformity. No evidence for
sequelae of occult craniocerebral trauma.
IMPRESSION: Mild subcortical and periventricular T2 and FLAIR hyperintensities,
likely chronic microvascular ischemic change.

No acute intracranial findings or posttraumatic sequelae.

## 2016-02-22 IMAGING — US US ABDOMEN LIMITED
1 series · 14 of 25 positions shown · non-contrast
Comparison: None.

CLINICAL DATA: Right upper quadrant pain for 2 months

EXAM:
US ABDOMEN LIMITED - RIGHT UPPER QUADRANT

[Series 1: us abdomen limited · 0.28mm/px · 14 of 42 slices shown]
[im 1/42]
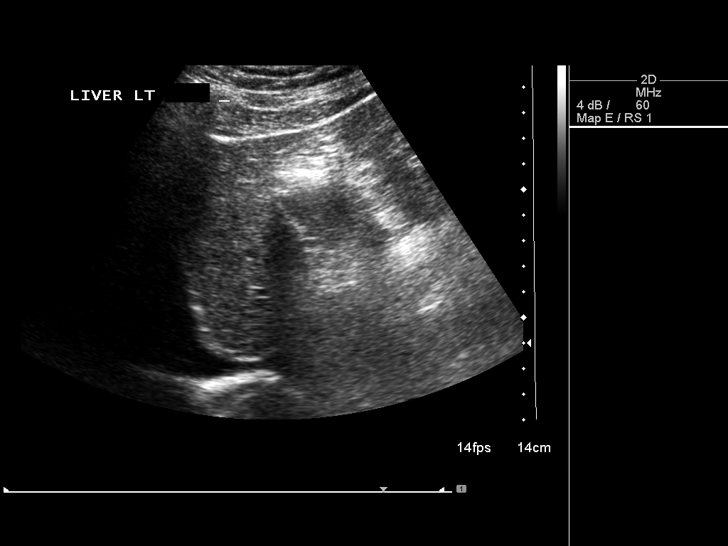
[im 4/42]
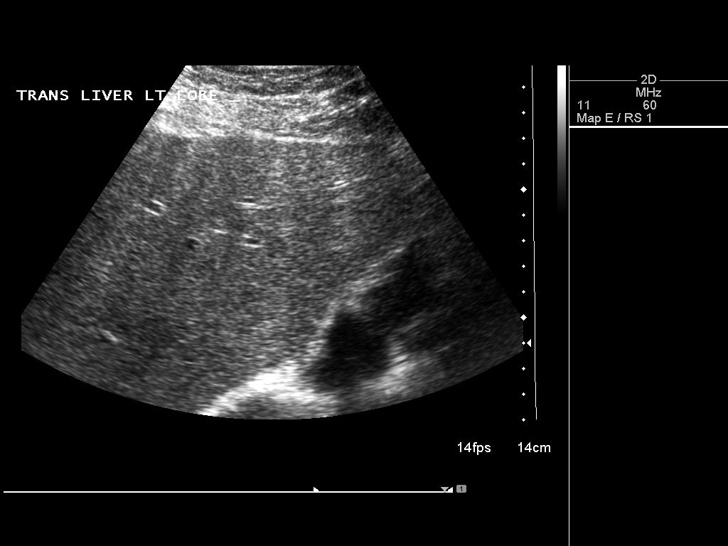
[im 7/42]
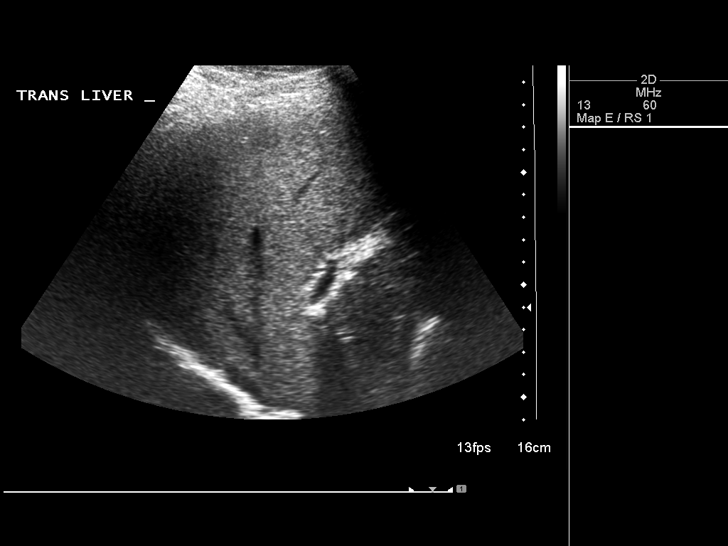
[im 11/42]
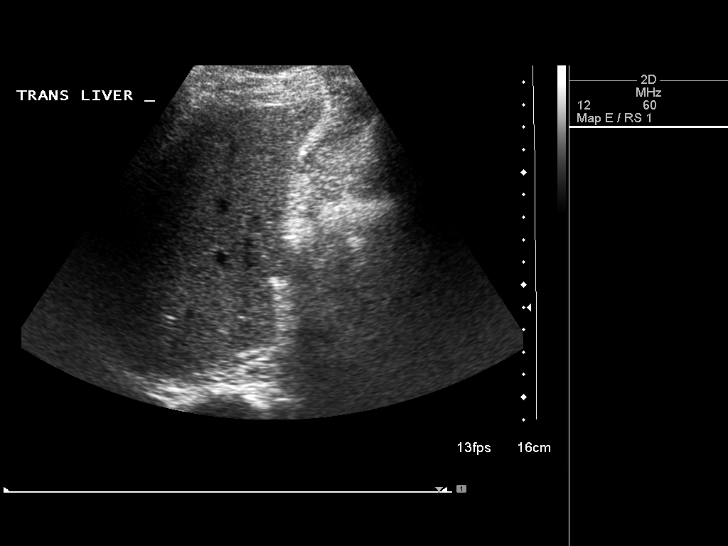
[im 14/42]
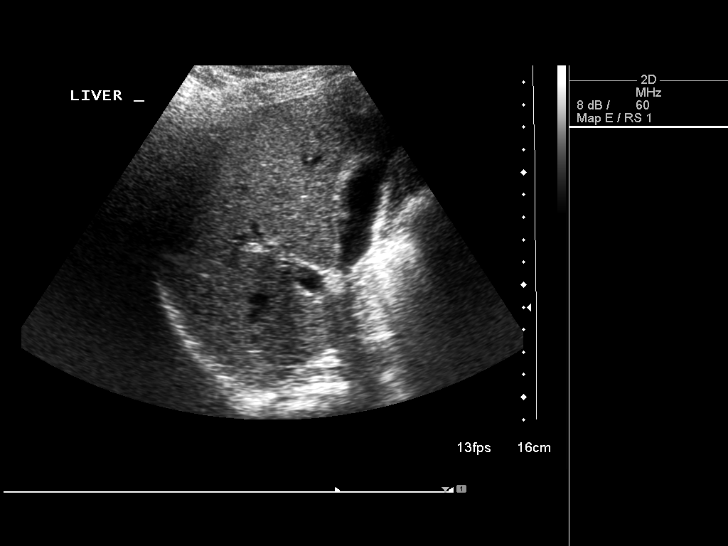
[im 16/42]
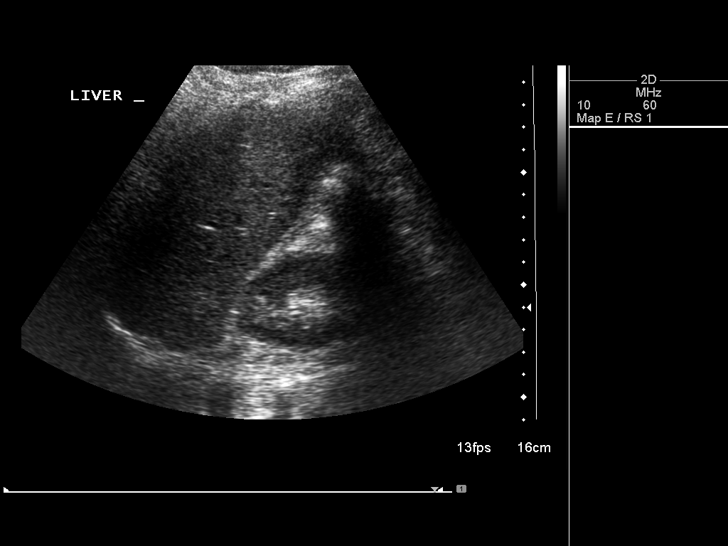
[im 19/42]
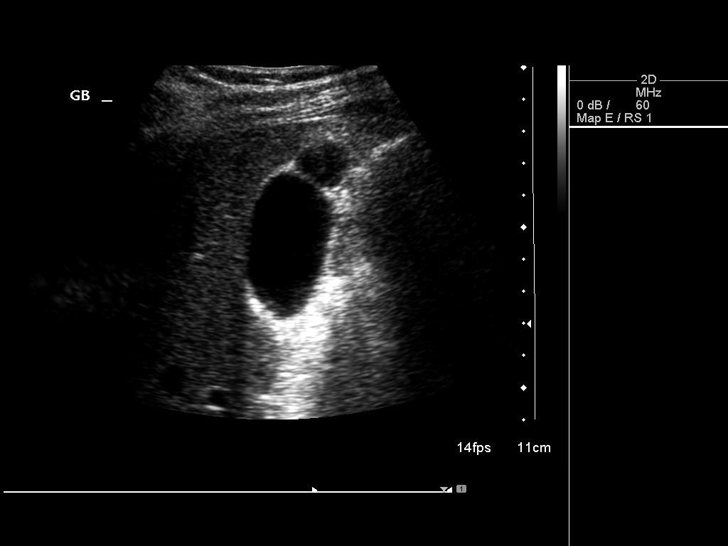
[im 23/42]
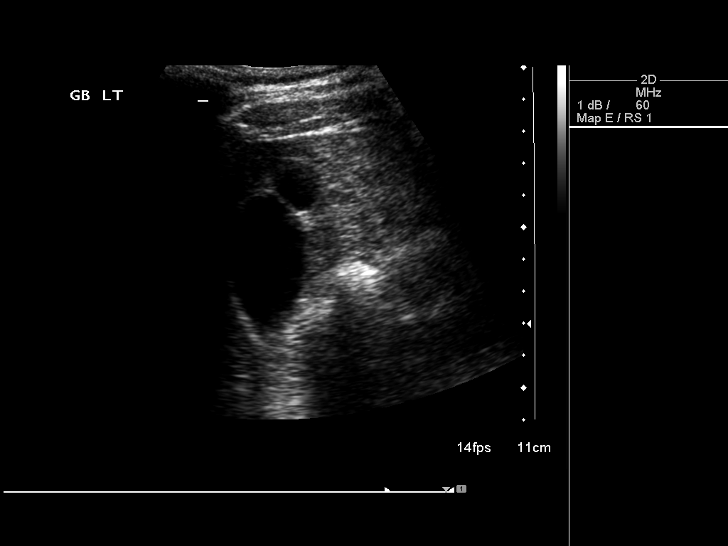
[im 26/42]
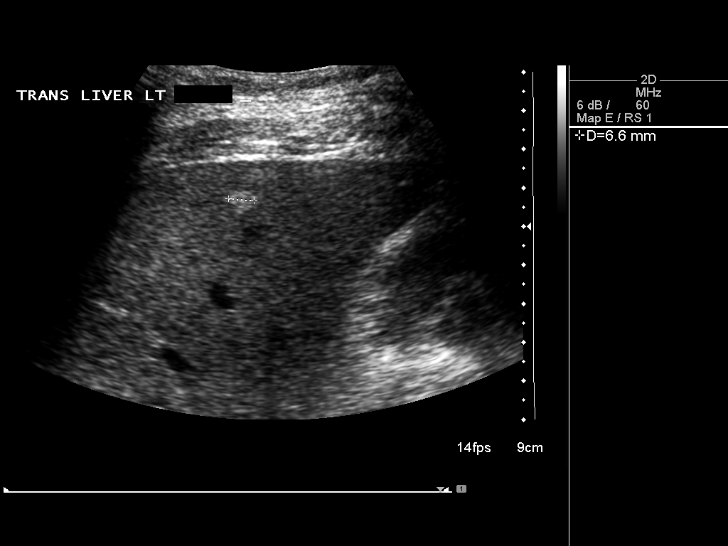
[im 28/42]
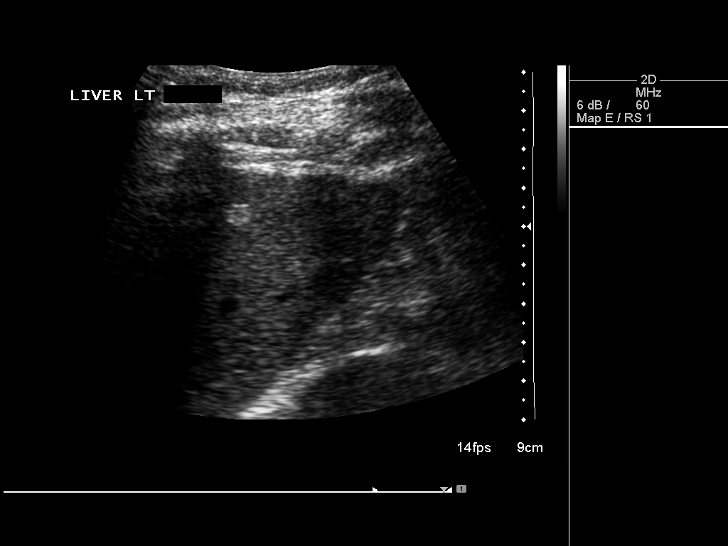
[im 31/42]
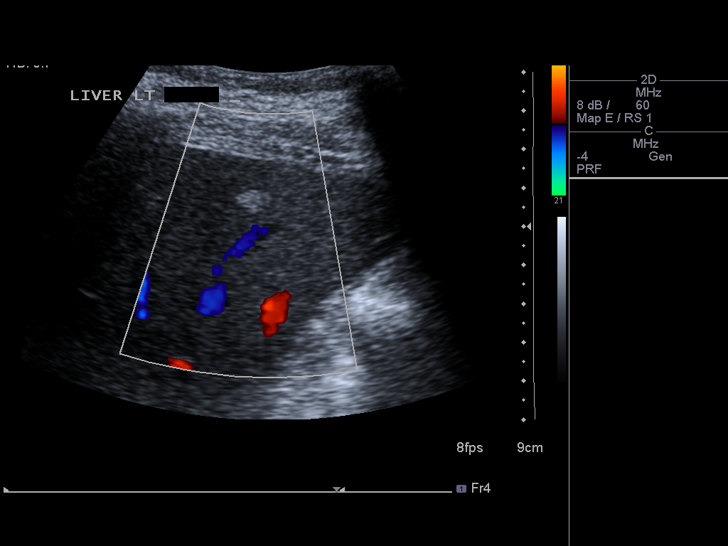
[im 35/42]
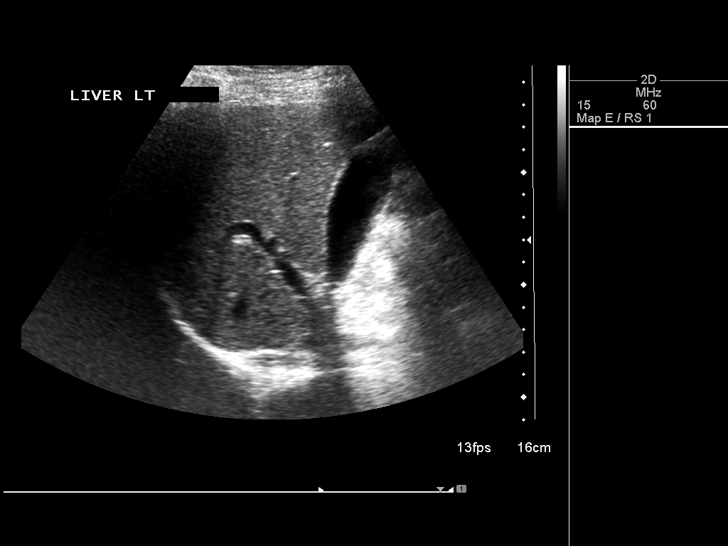
[im 38/42]
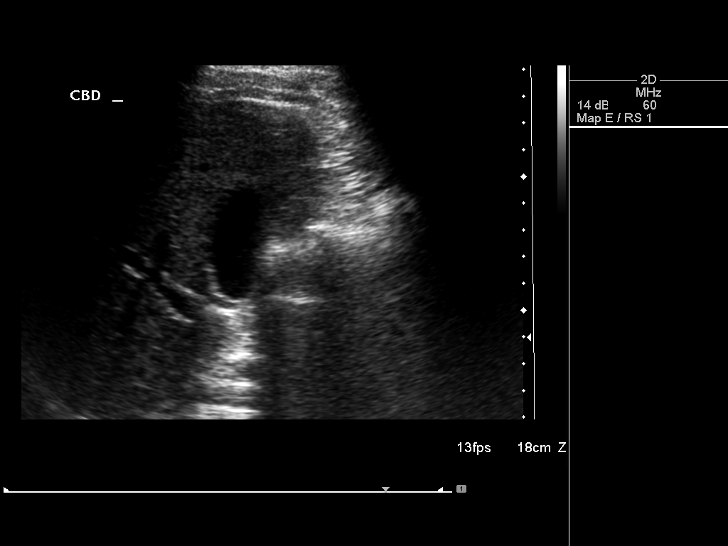
[im 42/42]
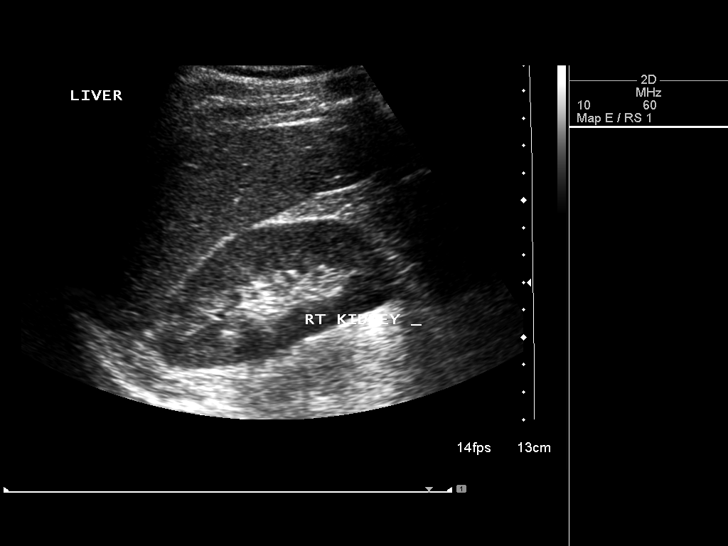

[14 of 25 positions shown; findings below may reference images not displayed]

FINDINGS: Gallbladder:

No gallstones or wall thickening visualized. No sonographic Murphy
sign noted.

Common bile duct:

Diameter: 3.2 mm in diameter within normal limits.

Liver:

The liver shows normal echogenicity. 7 x 6 mm hyperechoic lesion
anterior aspect of right hepatic lobe probable represents
hemangioma.
IMPRESSION: 1. No gallstones are noted within gallbladder.  Normal CBD.
2. Hyperechoic lesion in right hepatic lobe anteriorly measures 7 x
6 mm. Statistically this most likely represents a hemangioma.

## 2016-07-19 ENCOUNTER — Other Ambulatory Visit: Payer: Self-pay | Admitting: Gastroenterology

## 2016-07-19 DIAGNOSIS — R945 Abnormal results of liver function studies: Secondary | ICD-10-CM

## 2016-07-19 DIAGNOSIS — R7989 Other specified abnormal findings of blood chemistry: Secondary | ICD-10-CM

## 2016-07-21 ENCOUNTER — Ambulatory Visit
Admission: RE | Admit: 2016-07-21 | Discharge: 2016-07-21 | Disposition: A | Discharge status: Home / Self Care | Payer: BLUE CROSS/BLUE SHIELD | Source: Ambulatory Visit | Attending: Gastroenterology | Admitting: Gastroenterology

## 2016-07-21 DIAGNOSIS — R7989 Other specified abnormal findings of blood chemistry: Secondary | ICD-10-CM

## 2016-07-21 DIAGNOSIS — R945 Abnormal results of liver function studies: Secondary | ICD-10-CM

## 2016-08-16 ENCOUNTER — Encounter: Payer: Self-pay | Admitting: Allergy and Immunology

## 2016-08-16 ENCOUNTER — Ambulatory Visit (INDEPENDENT_AMBULATORY_CARE_PROVIDER_SITE_OTHER): Payer: BLUE CROSS/BLUE SHIELD | Admitting: Allergy and Immunology

## 2016-08-16 VITALS — BP 130/80 | HR 79 | Temp 97.8°F | Resp 20 | Ht 64.0 in | Wt 183.2 lb

## 2016-08-16 DIAGNOSIS — T7800XA Anaphylactic reaction due to unspecified food, initial encounter: Secondary | ICD-10-CM | POA: Diagnosis not present

## 2016-08-16 DIAGNOSIS — J3089 Other allergic rhinitis: Secondary | ICD-10-CM | POA: Diagnosis not present

## 2016-08-16 DIAGNOSIS — R062 Wheezing: Secondary | ICD-10-CM

## 2016-08-16 MED ORDER — EPINEPHRINE 0.3 MG/0.3ML IJ SOAJ: 0.3000 mg | Freq: Once | INTRAMUSCULAR | 2 refills | Status: AC

## 2016-08-16 MED ORDER — FLUTICASONE PROPIONATE 50 MCG/ACT NA SUSP: 2.0000 | Freq: Every day | NASAL | 5 refills | Status: DC

## 2016-08-16 NOTE — Assessment & Plan Note (Signed)
   Aeroallergen avoidance measures have been discussed and provided in written form.  A prescription has been provided for fluticasone nasal spray, 2 sprays per nostril daily as needed. Proper nasal spray technique has been discussed and demonstrated.  I have also recommended nasal saline spray (i.e., Simply Saline) or nasal saline lavage (i.e., NeilMed) as needed prior to medicated nasal sprays. 

## 2016-08-16 NOTE — Assessment & Plan Note (Signed)
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  For now, continue avoidance of cow's milk and egg as discussed.  A prescription has been provided for epinephrine auto-injector 2 pack along with instructions for proper administration.  A food allergy action plan has been provided and discussed.  Given the history, I believe that an open graded oral challenge would be appropriate if the patient is so inclined.

## 2016-08-16 NOTE — Patient Instructions (Addendum)
History of food allergy The patient has elevated serum specific IgE against cow's milk and egg in the context of a low probability history.  For now, continue avoidance of cow's milk and egg as discussed.  A prescription has been provided for epinephrine auto-injector 2 pack along with instructions for proper administration.  A food allergy action plan has been provided and discussed.  Given the history, I believe that an open graded oral challenge would be appropriate if the patient is so inclined.  Perennial and seasonal allergic rhinitis  Aeroallergen avoidance measures have been discussed and provided in written form.  A prescription has been provided for fluticasone nasal spray, 2 sprays per nostril daily as needed. Proper nasal spray technique has been discussed and demonstrated.  I have also recommended nasal saline spray (i.e., Simply Saline) or nasal saline lavage (i.e., NeilMed) as needed prior to medicated nasal sprays.  Wheezing  Continue albuterol HFA, 1-2 inhalations every 4-6 hours as needed.   Return for open graded oral challenge.  Control of Mold Allergen  Mold and fungi can grow on a variety of surfaces provided certain temperature and moisture conditions exist.  Outdoor molds grow on plants, decaying vegetation and soil.  The major outdoor mold, Alternaria and Cladosporium, are found in very high numbers during hot and dry conditions.  Generally, a late Summer - Fall peak is seen for common outdoor fungal spores.  Rain will temporarily lower outdoor mold spore count, but counts rise rapidly when the rainy period ends.  The most important indoor molds are Aspergillus and Penicillium.  Dark, humid and poorly ventilated basements are ideal sites for mold growth.  The next most common sites of mold growth are the bathroom and the kitchen.  Outdoor MicrosoftMold Control 1. Use air conditioning and keep windows closed 2. Avoid exposure to decaying vegetation. 3. Avoid leaf  raking. 4. Avoid grain handling. 5. Consider wearing a face mask if working in moldy areas.  Indoor Mold Control 1. Maintain humidity below 50%. 2. Clean washable surfaces with 5% bleach solution. 3. Remove sources e.g. Contaminated carpets.  Reducing Pollen Exposure  The American Academy of Allergy, Asthma and Immunology suggests the following steps to reduce your exposure to pollen during allergy seasons.    1. Do not hang sheets or clothing out to dry; pollen may collect on these items. 2. Do not mow lawns or spend time around freshly cut grass; mowing stirs up pollen. 3. Keep windows closed at night.  Keep car windows closed while driving. 4. Minimize morning activities outdoors, a time when pollen counts are usually at their highest. 5. Stay indoors as much as possible when pollen counts or humidity is high and on windy days when pollen tends to remain in the air longer. 6. Use air conditioning when possible.  Many air conditioners have filters that trap the pollen spores. 7. Use a HEPA room air filter to remove pollen form the indoor air you breathe.

## 2016-08-16 NOTE — Assessment & Plan Note (Signed)
   Continue albuterol HFA, 1-2 inhalations every 4-6 hours as needed. 

## 2016-08-16 NOTE — Progress Notes (Signed)
New Patient Note  RE: Jason AnconaGary R Marquez [name] DOB: 09/06/1965 Date of Office Visit: 08/16/2016  Referring provider: Richmond CampbellKaplan, Kristen W., PA-C Primary care provider: Lilia ArgueKAPLAN,KRISTEN, PA-C  Chief Complaint: Food Intolerance and Nasal Congestion   History of present illness: Jason LeafGary Marquez is a 51 y.o. male seen today in consultation requested by Mady GemmaKristen Kaplan, PA-C. He is accompanied today by his wife who assists with a history.  From early November through early January he seemed to be "constantly sick" with upper respiratory tract infections and persistent fatigue.  Wheezing was noted on examination by his primary care physician in December and was given a prescription for albuterol HFA.  As a result, extensive laboratory evaluation was done and he was found to have elevated serum specific IgE to cow's milk, soybean, wheat, egg, almond, shrimp, and sesame seed.  All of these results were low positive (class 0/1 or 1) with the exception of egg which was class 2 and cow's milk which was class 3.  He believes that he is able to consume these foods without adverse symptoms, the exception of milk which causes abdominal discomfort. He has not consumed egg since last October or November because he does not particularly like them. He denies concomitant cutaneous or cardiopulmonary symptoms with the consumption of any of these foods. Jason HiddenGary experiences frequent nasal congestion, rhinorrhea, sneezing, postnasal drainage, occasional sinus pressure, and occasional ocular pruritus.  These symptoms occur year around but he believes that they tend to be more frequent and severe with pollen exposure.   Assessment and plan: History of food allergy The patient has elevated serum specific IgE against cow's milk and egg in the context of a low probability history.  For now, continue avoidance of cow's milk and egg as discussed.  A prescription has been provided for epinephrine auto-injector 2 pack along with  instructions for proper administration.  A food allergy action plan has been provided and discussed.  Given the history, I believe that an open graded oral challenge would be appropriate if the patient is so inclined.  Perennial and seasonal allergic rhinitis  Aeroallergen avoidance measures have been discussed and provided in written form.  A prescription has been provided for fluticasone nasal spray, 2 sprays per nostril daily as needed. Proper nasal spray technique has been discussed and demonstrated.  I have also recommended nasal saline spray (i.e., Simply Saline) or nasal saline lavage (i.e., NeilMed) as needed prior to medicated nasal sprays.  Wheezing  Continue albuterol HFA, 1-2 inhalations every 4-6 hours as needed.   Meds ordered this encounter  Medications  . EPINEPHrine (AUVI-Q) 0.3 mg/0.3 mL IJ SOAJ injection    Sig: Inject 0.3 mLs (0.3 mg total) into the muscle once.    Dispense:  2 Device    Refill:  2  . fluticasone (FLONASE) 50 MCG/ACT nasal spray    Sig: Place 2 sprays into both nostrils daily.    Dispense:  1 g    Refill:  5    Diagnostics: Spirometry:  Normal with an FEV1 of 97% predicted.  Please see scanned spirometry results for details. Environmental skin testing: Positive to weed pollen and mold. Food allergen skin testing: Positive to cow's milk and egg white.    Physical examination: Blood pressure 130/80, pulse 79, temperature 97.8 F (36.6 C), temperature source Oral, resp. rate 20, height 5\' 4"  (1.626 m), weight 183 lb 3.2 oz (83.1 kg), SpO2 97 %.  General: Alert, interactive, in no acute distress. HEENT: TMs  pearly gray, turbinates moderately edematous without discharge, post-pharynx mildly erythematous. Neck: Supple without lymphadenopathy. Lungs: Clear to auscultation without wheezing, rhonchi or rales. CV: Normal S1, S2 without murmurs. Abdomen: Nondistended, nontender. Skin: Warm and dry, without lesions or rashes. Extremities:  No  clubbing, cyanosis or edema. Neuro:   Grossly intact.  Review of systems:  Review of systems negative except as noted in HPI / PMHx or noted below: Review of Systems  Constitutional: Negative.   HENT: Negative.   Eyes: Negative.   Respiratory: Negative.   Cardiovascular: Negative.   Gastrointestinal: Negative.   Genitourinary: Negative.   Musculoskeletal: Negative.   Skin: Negative.   Neurological: Negative.   Endo/Heme/Allergies: Negative.   Psychiatric/Behavioral: Negative.     Past medical history:  Past Medical History:  Diagnosis Date  . Healthy adult     Past surgical history:  Past Surgical History:  Procedure Laterality Date  . HAND SURGERY  2005   left  . MANDIBLE FRACTURE SURGERY  1989    Family history: Family History  Problem Relation Age of Onset  . Diabetes Mother   . Diabetes Maternal Grandmother   . Diabetes Maternal Grandfather   . Cancer Paternal Grandmother     Social history: Social History   Social History  . Marital status: Married    Spouse name: Jason Marquez  . Number of children: 2  . Years of education: 12   Occupational History  . Not on file.   Social History Main Topics  . Smoking status: Never Smoker  . Smokeless tobacco: Never Used  . Alcohol use Yes     Comment: occas.  . Drug use: No  . Sexual activity: Not on file   Other Topics Concern  . Not on file   Social History Narrative   Lives with wife and 2 sons   Right handed    Drinks caffeine: once in a while, mostly tea    Environmental History: the patient lives in a 49 year old house with hardwood floors throughout and central air/heat.  There are dogs in house which have access to his bedroom.  He is a nonsmoker.  Allergies as of 08/16/2016      Reactions   Naproxen    Fatty Liver      Medication List       Accurate as of 08/16/16  6:51 PM. Always use your most recent med list.          EPINEPHrine 0.3 mg/0.3 mL Soaj injection Commonly known as:   AUVI-Q Inject 0.3 mLs (0.3 mg total) into the muscle once.   fluticasone 50 MCG/ACT nasal spray Commonly known as:  FLONASE Place 2 sprays into both nostrils daily.   meclizine 25 MG tablet Commonly known as:  ANTIVERT Take by mouth.       Known medication allergies: Allergies  Allergen Reactions  . Naproxen     Fatty Liver    I appreciate the opportunity to take part in Ahad's care. Please do not hesitate to contact me with questions.  Sincerely,   R. Jorene Guest, MD

## 2016-08-24 ENCOUNTER — Telehealth: Payer: Self-pay

## 2016-08-24 NOTE — Telephone Encounter (Signed)
Called patient and spoke with Jason Marquez. Informed her that [facility] was unable to contact them. I gave [name] the phone number to contact [facility].

## 2016-09-03 ENCOUNTER — Encounter: Payer: Self-pay | Admitting: *Deleted

## 2020-12-17 ENCOUNTER — Other Ambulatory Visit: Payer: Self-pay | Admitting: Neurosurgery

## 2021-01-05 NOTE — Pre-Procedure Instructions (Signed)
Surgical Instructions:    Your procedure is scheduled on Thursday, June 30th (11:23 AM- 2:29 PM).  Report to [facility] Main Entrance "A" at 09:20 A.M., then check in with the Admitting office.  Call this number if you have any questions prior to your surgery date, or have problems the morning of surgery:  [phone]    Remember:  Do not eat or drink after midnight the night before your surgery.     Take these medicines the morning of surgery with A SIP OF WATER:   atorvastatin (LIPITOR) meclizine (ANTIVERT)  omeprazole (PRILOSEC)   As of today, STOP taking any Aspirin (unless otherwise instructed by your surgeon) Aleve, Naproxen, Ibuprofen, Motrin, Advil, Goody's, BC's, all herbal medications, fish oil, and all vitamins.             Special instructions:    [facility]- Preparing For Surgery  Before surgery, you can play an important role. Because skin is not sterile, your skin needs to be as free of germs as possible. You can reduce the number of germs on your skin by washing with CHG (chlorahexidine gluconate) Soap before surgery.  CHG is an antiseptic cleaner which kills germs and bonds with the skin to continue killing germs even after washing.     Please do not use if you have an allergy to CHG or antibacterial soaps. If your skin becomes reddened/irritated stop using the CHG.  Do not shave (including legs and underarms) for at least 48 hours prior to first CHG shower. It is OK to shave your face.  Please follow these instructions carefully.     Shower the NIGHT BEFORE SURGERY and the MORNING OF SURGERY with CHG Soap.   If you chose to wash your hair, wash your hair first as usual with your normal shampoo. After you shampoo, rinse your hair and body thoroughly to remove the shampoo.  Then [employer] and genitals (private parts) with your normal soap and rinse thoroughly to remove soap.  After that Use CHG Soap as you would any other liquid soap. You can apply CHG  directly to the skin and wash gently with a scrungie or a clean washcloth.   Apply the CHG Soap to your body ONLY FROM THE NECK DOWN.  Do not use on open wounds or open sores. Avoid contact with your eyes, ears, mouth and genitals (private parts). Wash Face and genitals (private parts)  with your normal soap.   Wash thoroughly, paying special attention to the area where your surgery will be performed.  Thoroughly rinse your body with warm water from the neck down.  DO NOT shower/wash with your normal soap after using and rinsing off the CHG Soap.  Pat yourself dry with a CLEAN TOWEL.  Wear CLEAN PAJAMAS to bed the night before surgery  Place CLEAN SHEETS on your bed the night before your surgery  DO NOT SLEEP WITH PETS.   Day of Surgery:  Take a shower with CHG soap. Wear Clean/Comfortable clothing the morning of surgery Do not wear lotions, powders, colognes, or deodorant.   Remember to brush your teeth WITH YOUR REGULAR TOOTHPASTE. Do not wear jewelry. Men may shave face and neck. Do not bring valuables to the hospital. [facility] is not responsible for any belongings or valuables.  Do NOT Smoke (Tobacco/Vaping) or drink Alcohol 24 hours prior to your procedure.  If you use a CPAP at night, you may bring all equipment for your overnight stay.   Contacts, glasses,  dentures or bridgework may not be worn into surgery, please bring cases for these belongings.   For patients admitted to the hospital, discharge time will be determined by your treatment team.  Patients discharged the day of surgery will not be allowed to drive home, and someone needs to stay with them for 24 hours.  ONLY 1 SUPPORT PERSON MAY BE PRESENT WHILE YOU ARE IN SURGERY. IF YOU ARE TO BE ADMITTED ONCE YOU ARE IN YOUR ROOM YOU WILL BE ALLOWED TWO (2) VISITORS.  Minor children may have two parents present. Special consideration for safety and communication needs will be reviewed on a case by case basis.      Please read over the following fact sheets that you were given.

## 2021-01-05 NOTE — H&P (Signed)
Patient ID:   [id number] Patient: Jason Marquez  Date of Birth: 1966/02/15 Visit Type: Office Visit   Date: 12/10/2020 09:15 AM Provider: Danae Orleans. Venetia Maxon MD   This 55 year old male presents for back pain.  HISTORY OF PRESENT ILLNESS:  1.  back pain  Jason Marquez is a 55 year old male who works as a Games developer for [employer], was last seen in the office in January of 2011, presents today for continued follow-up in regards to his chronic low back pain and previously diagnosed bilateral L5 pars defect.  The patient continues to suffer from low back pain and bilateral lower extremity radiculopathy on a daily basis and has been doing so since his last visit in 2011. However, over the last couple of years, the patient reports that his symptoms have been progressively worsening.  Today, he reports midline low back pain that radiates into his bilateral buttocks and into his bilateral upper legs stopping just superior to his knees.  He reports the low back pain is constant and the radiculopathy is intermittent.  He currently rates his pain to be a 2 to 3/10 sitting in the exam room today.  However, he does get exacerbations of pain that reaches a 9 to 10/10.  The patient's pain is exacerbated with anything that causes him to do forward flexion at the waist.  He has difficulty with ambulating up stairs and getting in out of his car due to subjective weakness.  He is able to obtain some relief with sitting and lying down, resting, and activity modifications.  He has undergone 2 epidural steroid injections by Dr. Ethelene Hal.  The 1st injection was reported to work well.  However, that 2nd injection he obtained no relief.  Medications:  Tramadol 50 mg p.r.n.  Past medical history:  None reported  Past surgical history:  None reported          PAST MEDICAL/SURGICAL HISTORY:   (Detailed)       Family History:  (Detailed)   Social History:  (Detailed) Tobacco use reviewed. Preferred language is [place].    Tobacco use status: Current non-smoker. Smoking status: Never smoker.  SMOKING STATUS Type Smoking Status Usage Per Day Years Used Total Pack Years   Never smoker          MEDICATIONS: (added, continued or stopped this visit) Started Medication Directions Instruction Stopped   atorvastatin 10 mg tablet take 1 tablet by oral route  every day     losartan 25 mg tablet take 1 tablet by oral route  every day     meclizine 25 mg tablet      omeprazole 20 mg tablet,delayed release take 1 capsule by oral route  every day     tramadol 50 mg tablet        ALLERGIES: Ingredient Reaction Medication Name Comment  NO KNOWN ALLERGIES     No known allergies. Reviewed, updated.    PHYSICAL EXAM:   Vitals Date Temp F BP Pulse Ht In Wt Lb BMI BSA Pain Score  12/10/2020  120/75 88 64 186.2 31.96  3/10    PHYSICAL EXAM Details General Level of Distress: no acute distress Overall Appearance: normal    Cardiovascular Cardiac: regular rate and rhythm without murmur  Respiratory Lungs: clear to auscultation  Neurological Recent and Remote Memory: normal Attention Span and Concentration:   normal Language: normal Fund of Knowledge: normal  Right Left Sensation: normal normal Upper Extremity Coordination: normal normal  Lower Extremity Coordination: normal normal  Musculoskeletal Gait and Station: normal  Right Left Upper Extremity Muscle Strength: normal normal Lower Extremity Muscle Strength: normal normal Upper Extremity Muscle Tone:  normal normal Lower Extremity Muscle Tone: normal normal   Motor Strength Upper and lower extremity motor strength was tested in the clinically pertinent muscles.     Deep Tendon Reflexes  Right Left Biceps: normal normal Triceps: normal normal Brachioradialis: normal normal Patellar: normal normal Achilles: normal normal  Sensory Sensation was tested at L1 to S1.   Cranial Nerves II. Optic Nerve/Visual  Fields: normal III. Oculomotor: normal IV. Trochlear: normal V. Trigeminal: normal VI. Abducens: normal VII. Facial: normal VIII. Acoustic/Vestibular: normal IX. Glossopharyngeal: normal X. Vagus: normal XI. Spinal Accessory: normal XII. Hypoglossal: normal  Motor and other Tests Lhermittes: negative Rhomberg: negative    Right Left Hoffman's: normal normal Clonus: normal normal Babinski: normal normal SLR: negative negative Patrick's Pearlean Brownie): negative negative Toe Walk: normal normal Toe Lift: normal normal Heel Walk: normal normal SI Joint: nontender nontender     DIAGNOSTIC RESULTS:   Lumbar MRI from 08/28/2020 reviewed with patient.  Four view lumbar radiographs performed and reviewed with patient.  The patient's imaging is most remarkable for a grade 1 anterolisthesis of L5 on S1 (neutral 5.4 mm, extension 5 mm, flexion 6.5 mm) with a bilateral L5 pars defect.  He also has a significant amount of degeneration of the disc with loss of disc height at the L5-S1 level.  There is also bilateral foraminal stenosis that is impinging his bilateral L5 nerve roots, left greater than right.  L4 on L5 retrolisthesis with mild bulging disc at the L4-5 level.    IMPRESSION:   55 year old male with severe and intractable chronic low back pain and bilateral radiculopathy.  Despite the patient undergoing conservative treatment that has consisted of medications and epidural steroid injections, the patient's symptoms are continuing to progressively worsen and are significantly impacting his quality of life.  On examination the patient had full strength on confrontational testing and a negative seated straight leg raise bilaterally.  The patient's imaging was most remarkable for an L5 on S1 anterolisthesis with bilateral L5 pars defect which is leading to multifactorial bilateral foraminal stenosis.  We discussed potential treatment options with the patient which included continuing epidural  steroid injections, physical therapy, and surgical intervention.  Since the patient has already failed conservative treatment, I have counseled the patient to undergo surgical intervention.  This will consist of an L5-S1 PLIF.  Risks and benefits of the surgery were discussed in detail with the patient and he wishes to proceed with surgery.  PLAN:  Proceed with L5-S1 PLIF.  Detailed patient Education was provided today and all of his questions were answered.  We will request the patient's epidural steroid injection records.  We will have him follow-up 3 weeks after his discharge from the hospital.  He was provided a prescription for an LSO brace.  Orders: Office Procedures/Services: Assessment Service Comments   Please request the patient's ESI records    Diagnostic Procedures: Assessment Procedure  M54.42 Lumbar Spine- AP/Lat/Flex/Ex  Instruction(s)/Education: Assessment Instruction  Z68.31 Dietary management education, guidance, and counseling  Miscellaneous: Assessment   M43.16 LSO Brace   Completed Orders (this encounter) Order Details Reason Side Interpretation Result Initial Treatment Date Region  Lumbar Spine- AP/Lat/Flex/Ex      12/10/2020 All Levels to All Levels  Dietary management education, guidance, and counseling Encouraged patient to eat well balanced diet.         Assessment/Plan   #  Detail Type Description   1. Assessment Lumbar radiculopathy (M54.16).       2. Assessment Lumbar facet arthropathy (M47.816).       3. Assessment Spondylolisthesis of lumbar region (M43.16).   Plan Orders LSO Brace.       4. 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       5. Assessment Disc degeneration, lumbar (M51.36).       6. Assessment Lumbar foraminal stenosis (M48.061).       7. Assessment Chronic bilateral low back pain with bilateral sciatica (M54.42).       8. Assessment Lumbago with sciatica, right side (M54.41).       9.  Assessment Other chronic pain (G89.29).      10. Assessment Body mass index (BMI) 31.0-31.9, adult (Z68.31).   Plan Orders Today's instructions / counseling include(s) Dietary management education, guidance, and counseling. Clinical information/comments: Encouraged patient to eat well balanced diet.      *. Other Orders Orders not associated to today's assessments.   Plan Orders Please request the patient's ESI records.     Pain Management Plan Pain Scale: 3/10. Method: Numeric Pain Intensity Scale. Location: back. Onset: 11/09/2001. Duration: varies. Quality: discomforting. Pain management follow-up plan of care: Patient will continue medication management..              Provider:  Danae Orleans. Venetia Maxon MD  12/11/2020 01:25 PM    Dictation edited by: Benita Gutter, NP    CC Providers: Marjory Lies [facility] [address],  [place]  [address]-   Marjory Lies  [facility] [address] [place], [place] [address]-               Electronically signed by Benita Gutter NP on 12/11/2020 01:25 PM  on behalf of Danae Orleans. Venetia Maxon MD

## 2021-01-06 ENCOUNTER — Other Ambulatory Visit: Payer: Self-pay

## 2021-01-06 ENCOUNTER — Encounter (HOSPITAL_COMMUNITY)
Admission: RE | Admit: 2021-01-06 | Discharge: 2021-01-06 | Disposition: A | Discharge status: Home / Self Care | Payer: Commercial Managed Care - PPO | Source: Ambulatory Visit | Attending: Neurosurgery | Admitting: Neurosurgery

## 2021-01-06 ENCOUNTER — Encounter (HOSPITAL_COMMUNITY): Payer: Self-pay

## 2021-01-06 DIAGNOSIS — Z01812 Encounter for preprocedural laboratory examination: Secondary | ICD-10-CM | POA: Insufficient documentation

## 2021-01-06 DIAGNOSIS — Z20822 Contact with and (suspected) exposure to covid-19: Secondary | ICD-10-CM | POA: Insufficient documentation

## 2021-01-06 HISTORY — DX: Gastro-esophageal reflux disease without esophagitis: K21.9

## 2021-01-06 HISTORY — DX: Essential (primary) hypertension: I10

## 2021-01-06 LAB — CBC
HCT: 42.9 % (ref 39.0–52.0)
Hemoglobin: 14.6 g/dL (ref 13.0–17.0)
MCH: 28.5 pg (ref 26.0–34.0)
MCHC: 34 g/dL (ref 30.0–36.0)
MCV: 83.6 fL (ref 80.0–100.0)
Platelets: 193 10*3/uL (ref 150–400)
RBC: 5.13 MIL/uL (ref 4.22–5.81)
RDW: 13 % (ref 11.5–15.5)
WBC: 9.3 10*3/uL (ref 4.0–10.5)
nRBC: 0 % (ref 0.0–0.2)

## 2021-01-06 LAB — SURGICAL PCR SCREEN
MRSA, PCR: NEGATIVE
Staphylococcus aureus: POSITIVE — AB

## 2021-01-06 LAB — BASIC METABOLIC PANEL
Anion gap: 12 (ref 5–15)
BUN: 18 mg/dL (ref 6–20)
CO2: 26 mmol/L (ref 22–32)
Calcium: 8.9 mg/dL (ref 8.9–10.3)
Chloride: 101 mmol/L (ref 98–111)
Creatinine, Ser: 0.92 mg/dL (ref 0.61–1.24)
GFR, Estimated: >60 mL/min (ref >60)
Glucose, Bld: 86 mg/dL (ref 70–99)
Potassium: 3.6 mmol/L (ref 3.5–5.1)
Sodium: 139 mmol/L (ref 135–145)

## 2021-01-06 LAB — TYPE AND SCREEN
ABO/RH(D): AB POS
Antibody Screen: NEGATIVE

## 2021-01-06 LAB — SARS CORONAVIRUS 2 (TAT 6-24 HRS): SARS Coronavirus 2: NEGATIVE

## 2021-01-06 NOTE — Progress Notes (Signed)
PCP - Mady Gemma, PA-C Cardiologist - denies  PPM/ICD - denies Device Orders - N/A Rep Notified - N/A  Chest x-ray - N/A EKG - 01/06/2021 Stress Test - denies ECHO - 01/09/2014 Cardiac Cath - denies  Sleep Study - denies CPAP - N/A  Fasting Blood Sugar - N/A  Blood Thinner Instructions: N/A Aspirin Instructions: Patient was instructed: As of today, STOP taking any Aspirin (unless otherwise instructed by your surgeon) Aleve, Naproxen, Ibuprofen, Motrin, Advil, Goody's, BC's, all herbal medications, fish oil, and all vitamins.  ERAS Protcol - N/A PRE-SURGERY Ensure or G2- N/A  COVID TEST- 01/06/2021   Anesthesia review: No  Patient denies shortness of breath, fever, cough and chest pain at PAT appointment   All instructions explained to the patient, with a verbal understanding of the material. Patient agrees to go over the instructions while at home for a better understanding. Patient also instructed to self quarantine after being tested for COVID-19. The opportunity to ask questions was provided.

## 2021-01-08 ENCOUNTER — Inpatient Hospital Stay (HOSPITAL_COMMUNITY): Payer: Commercial Managed Care - PPO | Admitting: Certified Registered"

## 2021-01-08 ENCOUNTER — Encounter (HOSPITAL_COMMUNITY): Payer: Self-pay | Admitting: Neurosurgery

## 2021-01-08 ENCOUNTER — Inpatient Hospital Stay (HOSPITAL_COMMUNITY)
Admission: RE | Admit: 2021-01-08 | Discharge: 2021-01-09 | DRG: 455 | Disposition: A | Discharge status: Home / Self Care | Payer: Commercial Managed Care - PPO | Attending: Neurosurgery | Admitting: Neurosurgery

## 2021-01-08 ENCOUNTER — Inpatient Hospital Stay (HOSPITAL_COMMUNITY): Payer: Commercial Managed Care - PPO

## 2021-01-08 ENCOUNTER — Inpatient Hospital Stay (HOSPITAL_COMMUNITY)
Admission: RE | Disposition: A | Discharge status: Home / Self Care | Payer: Self-pay | Source: Home / Self Care | Attending: Neurosurgery

## 2021-01-08 DIAGNOSIS — M5442 Lumbago with sciatica, left side: Secondary | ICD-10-CM | POA: Diagnosis present

## 2021-01-08 DIAGNOSIS — M4306 Spondylolysis, lumbar region: Secondary | ICD-10-CM | POA: Diagnosis present

## 2021-01-08 DIAGNOSIS — Z20822 Contact with and (suspected) exposure to covid-19: Secondary | ICD-10-CM | POA: Diagnosis present

## 2021-01-08 DIAGNOSIS — Z888 Allergy status to other drugs, medicaments and biological substances status: Secondary | ICD-10-CM | POA: Diagnosis not present

## 2021-01-08 DIAGNOSIS — M5116 Intervertebral disc disorders with radiculopathy, lumbar region: Secondary | ICD-10-CM | POA: Diagnosis present

## 2021-01-08 DIAGNOSIS — Z79899 Other long term (current) drug therapy: Secondary | ICD-10-CM | POA: Diagnosis not present

## 2021-01-08 DIAGNOSIS — M4317 Spondylolisthesis, lumbosacral region: Secondary | ICD-10-CM | POA: Diagnosis present

## 2021-01-08 DIAGNOSIS — I10 Essential (primary) hypertension: Secondary | ICD-10-CM | POA: Diagnosis present

## 2021-01-08 DIAGNOSIS — M48062 Spinal stenosis, lumbar region with neurogenic claudication: Principal | ICD-10-CM | POA: Diagnosis present

## 2021-01-08 DIAGNOSIS — M5441 Lumbago with sciatica, right side: Secondary | ICD-10-CM | POA: Diagnosis present

## 2021-01-08 DIAGNOSIS — M5417 Radiculopathy, lumbosacral region: Secondary | ICD-10-CM | POA: Diagnosis present

## 2021-01-08 DIAGNOSIS — Z91018 Allergy to other foods: Secondary | ICD-10-CM | POA: Diagnosis not present

## 2021-01-08 DIAGNOSIS — Z419 Encounter for procedure for purposes other than remedying health state, unspecified: Secondary | ICD-10-CM

## 2021-01-08 LAB — ABO/RH: ABO/RH(D): AB POS

## 2021-01-08 SURGERY — POSTERIOR LUMBAR FUSION 1 LEVEL
Anesthesia: General | Site: Spine Lumbar

## 2021-01-08 MED ORDER — PANTOPRAZOLE SODIUM 40 MG PO TBEC: 40.0000 mg | DELAYED_RELEASE_TABLET | Freq: Every day | ORAL | Status: DC

## 2021-01-08 MED ORDER — FENTANYL CITRATE (PF) 250 MCG/5ML IJ SOLN
INTRAMUSCULAR | Status: AC
  Filled 2021-01-08: qty 5

## 2021-01-08 MED ORDER — MECLIZINE HCL 25 MG PO TABS
25.0000 mg | ORAL_TABLET | Freq: Every day | ORAL | Status: DC
  Administered 2021-01-09: 25 mg via ORAL
  Filled 2021-01-08 (×2): qty 1

## 2021-01-08 MED ORDER — MIDAZOLAM HCL 2 MG/2ML IJ SOLN
INTRAMUSCULAR | Status: AC
  Filled 2021-01-08: qty 2

## 2021-01-08 MED ORDER — DOCUSATE SODIUM 100 MG PO CAPS
100.0000 mg | ORAL_CAPSULE | Freq: Two times a day (BID) | ORAL | Status: DC
  Administered 2021-01-08 – 2021-01-09 (×2): 100 mg via ORAL
  Filled 2021-01-08 (×2): qty 1

## 2021-01-08 MED ORDER — BUPIVACAINE LIPOSOME 1.3 % IJ SUSP
INTRAMUSCULAR | Status: AC
  Filled 2021-01-08: qty 20

## 2021-01-08 MED ORDER — CHLORHEXIDINE GLUCONATE CLOTH 2 % EX PADS: 6.0000 | MEDICATED_PAD | Freq: Once | CUTANEOUS | Status: DC

## 2021-01-08 MED ORDER — KCL IN DEXTROSE-NACL 20-5-0.45 MEQ/L-%-% IV SOLN: INTRAVENOUS | Status: DC

## 2021-01-08 MED ORDER — METHOCARBAMOL 500 MG PO TABS
500.0000 mg | ORAL_TABLET | Freq: Four times a day (QID) | ORAL | Status: DC | PRN
  Administered 2021-01-08 – 2021-01-09 (×3): 500 mg via ORAL
  Filled 2021-01-08 (×3): qty 1

## 2021-01-08 MED ORDER — METHOCARBAMOL 1000 MG/10ML IJ SOLN: 500.0000 mg | Freq: Four times a day (QID) | INTRAVENOUS | Status: DC | PRN

## 2021-01-08 MED ORDER — CHLORHEXIDINE GLUCONATE 0.12 % MT SOLN: 15.0000 mL | Freq: Once | OROMUCOSAL | Status: AC

## 2021-01-08 MED ORDER — LIDOCAINE 2% (20 MG/ML) 5 ML SYRINGE
INTRAMUSCULAR | Status: DC | PRN
  Administered 2021-01-08: 40 mg via INTRAVENOUS

## 2021-01-08 MED ORDER — ONDANSETRON HCL 4 MG PO TABS: 4.0000 mg | ORAL_TABLET | Freq: Four times a day (QID) | ORAL | Status: DC | PRN

## 2021-01-08 MED ORDER — CEFAZOLIN SODIUM-DEXTROSE 2-4 GM/100ML-% IV SOLN: 2.0000 g | INTRAVENOUS | Status: DC

## 2021-01-08 MED ORDER — 0.9 % SODIUM CHLORIDE (POUR BTL) OPTIME
TOPICAL | Status: DC | PRN
  Administered 2021-01-08: 1000 mL

## 2021-01-08 MED ORDER — DEXMEDETOMIDINE (PRECEDEX) IN NS 20 MCG/5ML (4 MCG/ML) IV SYRINGE
PREFILLED_SYRINGE | INTRAVENOUS | Status: DC | PRN
  Administered 2021-01-08: 12 ug via INTRAVENOUS

## 2021-01-08 MED ORDER — LIDOCAINE-EPINEPHRINE 1 %-1:100000 IJ SOLN
INTRAMUSCULAR | Status: AC
  Filled 2021-01-08: qty 1

## 2021-01-08 MED ORDER — LIDOCAINE-EPINEPHRINE 1 %-1:100000 IJ SOLN
INTRAMUSCULAR | Status: DC | PRN
  Administered 2021-01-08: 5 mL

## 2021-01-08 MED ORDER — HYDROMORPHONE HCL 1 MG/ML IJ SOLN
0.5000 mg | INTRAMUSCULAR | Status: DC | PRN
Start: 2021-01-08 — End: 2021-01-09

## 2021-01-08 MED ORDER — PANTOPRAZOLE SODIUM 40 MG IV SOLR: 40.0000 mg | Freq: Every day | INTRAVENOUS | Status: DC

## 2021-01-08 MED ORDER — ROCURONIUM BROMIDE 10 MG/ML (PF) SYRINGE
PREFILLED_SYRINGE | INTRAVENOUS | Status: DC | PRN
  Administered 2021-01-08: 60 mg via INTRAVENOUS
  Administered 2021-01-08: 40 mg via INTRAVENOUS

## 2021-01-08 MED ORDER — DEXAMETHASONE SODIUM PHOSPHATE 10 MG/ML IJ SOLN
INTRAMUSCULAR | Status: DC | PRN
  Administered 2021-01-08: 10 mg via INTRAVENOUS

## 2021-01-08 MED ORDER — CEFAZOLIN SODIUM-DEXTROSE 2-4 GM/100ML-% IV SOLN
INTRAVENOUS | Status: AC
  Filled 2021-01-08: qty 100

## 2021-01-08 MED ORDER — PHENYLEPHRINE 40 MCG/ML (10ML) SYRINGE FOR IV PUSH (FOR BLOOD PRESSURE SUPPORT)
PREFILLED_SYRINGE | INTRAVENOUS | Status: DC | PRN
  Administered 2021-01-08: 40 ug via INTRAVENOUS

## 2021-01-08 MED ORDER — BUPIVACAINE HCL (PF) 0.5 % IJ SOLN
INTRAMUSCULAR | Status: DC | PRN
  Administered 2021-01-08: 5 mL

## 2021-01-08 MED ORDER — FENTANYL CITRATE (PF) 100 MCG/2ML IJ SOLN
INTRAMUSCULAR | Status: AC
  Filled 2021-01-08: qty 2

## 2021-01-08 MED ORDER — TRAMADOL HCL 50 MG PO TABS: 50.0000 mg | ORAL_TABLET | Freq: Every evening | ORAL | Status: DC | PRN

## 2021-01-08 MED ORDER — PROPOFOL 10 MG/ML IV BOLUS
INTRAVENOUS | Status: DC | PRN
  Administered 2021-01-08: 200 mg via INTRAVENOUS

## 2021-01-08 MED ORDER — ATORVASTATIN CALCIUM 10 MG PO TABS
10.0000 mg | ORAL_TABLET | Freq: Every day | ORAL | Status: DC
  Administered 2021-01-08 – 2021-01-09 (×2): 10 mg via ORAL
  Filled 2021-01-08: qty 1

## 2021-01-08 MED ORDER — ALUM & MAG HYDROXIDE-SIMETH 200-200-20 MG/5ML PO SUSP: 30.0000 mL | Freq: Four times a day (QID) | ORAL | Status: DC | PRN

## 2021-01-08 MED ORDER — MIDAZOLAM HCL 5 MG/5ML IJ SOLN
INTRAMUSCULAR | Status: DC | PRN
  Administered 2021-01-08: 2 mg via INTRAVENOUS

## 2021-01-08 MED ORDER — OXYCODONE HCL 5 MG/5ML PO SOLN: 5.0000 mg | Freq: Once | ORAL | Status: DC | PRN

## 2021-01-08 MED ORDER — ZOLPIDEM TARTRATE 5 MG PO TABS: 5.0000 mg | ORAL_TABLET | Freq: Every evening | ORAL | Status: DC | PRN

## 2021-01-08 MED ORDER — OXYCODONE HCL 5 MG PO TABS: 5.0000 mg | ORAL_TABLET | Freq: Once | ORAL | Status: DC | PRN

## 2021-01-08 MED ORDER — THROMBIN 5000 UNITS EX SOLR: OROMUCOSAL | Status: DC | PRN

## 2021-01-08 MED ORDER — BISACODYL 10 MG RE SUPP: 10.0000 mg | Freq: Every day | RECTAL | Status: DC | PRN

## 2021-01-08 MED ORDER — THROMBIN 20000 UNITS EX SOLR: CUTANEOUS | Status: DC | PRN

## 2021-01-08 MED ORDER — LACTATED RINGERS IV SOLN: INTRAVENOUS | Status: DC

## 2021-01-08 MED ORDER — ACETAMINOPHEN 650 MG RE SUPP: 650.0000 mg | RECTAL | Status: DC | PRN

## 2021-01-08 MED ORDER — FENTANYL CITRATE (PF) 100 MCG/2ML IJ SOLN
INTRAMUSCULAR | Status: DC | PRN
  Administered 2021-01-08: 100 ug via INTRAVENOUS

## 2021-01-08 MED ORDER — ORAL CARE MOUTH RINSE: 15.0000 mL | Freq: Once | OROMUCOSAL | Status: AC

## 2021-01-08 MED ORDER — SODIUM CHLORIDE 0.9% FLUSH: 3.0000 mL | INTRAVENOUS | Status: DC | PRN

## 2021-01-08 MED ORDER — BUPIVACAINE HCL (PF) 0.5 % IJ SOLN
INTRAMUSCULAR | Status: AC
  Filled 2021-01-08: qty 30

## 2021-01-08 MED ORDER — LOSARTAN POTASSIUM 50 MG PO TABS
25.0000 mg | ORAL_TABLET | Freq: Every day | ORAL | Status: DC
  Administered 2021-01-08 – 2021-01-09 (×2): 25 mg via ORAL
  Filled 2021-01-08 (×2): qty 1

## 2021-01-08 MED ORDER — SODIUM CHLORIDE 0.9% FLUSH: 3.0000 mL | Freq: Two times a day (BID) | INTRAVENOUS | Status: DC

## 2021-01-08 MED ORDER — THROMBIN 5000 UNITS EX SOLR
CUTANEOUS | Status: AC
  Filled 2021-01-08: qty 5000

## 2021-01-08 MED ORDER — ACETAMINOPHEN 325 MG PO TABS
650.0000 mg | ORAL_TABLET | ORAL | Status: DC | PRN
  Administered 2021-01-08 – 2021-01-09 (×2): 650 mg via ORAL
  Filled 2021-01-08 (×2): qty 2

## 2021-01-08 MED ORDER — HYDROCODONE-ACETAMINOPHEN 5-325 MG PO TABS: 2.0000 | ORAL_TABLET | ORAL | Status: DC | PRN

## 2021-01-08 MED ORDER — OXYCODONE HCL 5 MG PO TABS
5.0000 mg | ORAL_TABLET | ORAL | Status: DC | PRN
  Administered 2021-01-08 – 2021-01-09 (×5): 10 mg via ORAL
  Filled 2021-01-08 (×5): qty 2

## 2021-01-08 MED ORDER — ONDANSETRON HCL 4 MG/2ML IJ SOLN
INTRAMUSCULAR | Status: DC | PRN
  Administered 2021-01-08: 4 mg via INTRAVENOUS

## 2021-01-08 MED ORDER — PHENYLEPHRINE HCL-NACL 10-0.9 MG/250ML-% IV SOLN
INTRAVENOUS | Status: DC | PRN
  Administered 2021-01-08: 50 ug/min via INTRAVENOUS

## 2021-01-08 MED ORDER — BUPIVACAINE LIPOSOME 1.3 % IJ SUSP
INTRAMUSCULAR | Status: DC | PRN
  Administered 2021-01-08: 20 mL

## 2021-01-08 MED ORDER — ONDANSETRON HCL 4 MG/2ML IJ SOLN: 4.0000 mg | Freq: Four times a day (QID) | INTRAMUSCULAR | Status: DC | PRN

## 2021-01-08 MED ORDER — FLEET ENEMA 7-19 GM/118ML RE ENEM: 1.0000 | ENEMA | Freq: Once | RECTAL | Status: DC | PRN

## 2021-01-08 MED ORDER — SODIUM CHLORIDE 0.9 % IV SOLN: 250.0000 mL | INTRAVENOUS | Status: DC

## 2021-01-08 MED ORDER — ONDANSETRON HCL 4 MG/2ML IJ SOLN: 4.0000 mg | Freq: Once | INTRAMUSCULAR | Status: DC | PRN

## 2021-01-08 MED ORDER — POLYETHYLENE GLYCOL 3350 17 G PO PACK: 17.0000 g | PACK | Freq: Every day | ORAL | Status: DC | PRN

## 2021-01-08 MED ORDER — FENTANYL CITRATE (PF) 100 MCG/2ML IJ SOLN
25.0000 ug | INTRAMUSCULAR | Status: DC | PRN
  Administered 2021-01-08 (×2): 50 ug via INTRAVENOUS

## 2021-01-08 MED ORDER — THROMBIN 20000 UNITS EX SOLR
CUTANEOUS | Status: AC
  Filled 2021-01-08: qty 20000

## 2021-01-08 MED ORDER — SUGAMMADEX SODIUM 200 MG/2ML IV SOLN
INTRAVENOUS | Status: DC | PRN
  Administered 2021-01-08: 200 mg via INTRAVENOUS

## 2021-01-08 MED ORDER — PANTOPRAZOLE SODIUM 40 MG PO TBEC
40.0000 mg | DELAYED_RELEASE_TABLET | Freq: Every day | ORAL | Status: DC
  Administered 2021-01-08: 40 mg via ORAL
  Filled 2021-01-08: qty 1

## 2021-01-08 MED ORDER — MENTHOL 3 MG MT LOZG: 1.0000 | LOZENGE | OROMUCOSAL | Status: DC | PRN

## 2021-01-08 MED ORDER — PHENOL 1.4 % MT LIQD: 1.0000 | OROMUCOSAL | Status: DC | PRN

## 2021-01-08 MED ORDER — CHLORHEXIDINE GLUCONATE 0.12 % MT SOLN
OROMUCOSAL | Status: AC
  Administered 2021-01-08: 15 mL via OROMUCOSAL
  Filled 2021-01-08: qty 15

## 2021-01-08 MED ORDER — CEFAZOLIN SODIUM-DEXTROSE 2-4 GM/100ML-% IV SOLN
2.0000 g | Freq: Three times a day (TID) | INTRAVENOUS | Status: AC
  Administered 2021-01-08 – 2021-01-09 (×2): 2 g via INTRAVENOUS
  Filled 2021-01-08 (×2): qty 100

## 2021-01-08 SURGICAL SUPPLY — 70 items
BAG COUNTER SPONGE SURGICOUNT (BAG) ×6 IMPLANT
BAG SURGICOUNT SPONGE COUNTING (BAG) ×2
BASKET BONE COLLECTION (BASKET) ×4 IMPLANT
BLADE CLIPPER SURG (BLADE) IMPLANT
BUR MATCHSTICK NEURO 3.0 LAGG (BURR) ×4 IMPLANT
BUR PRECISION FLUTE 5.0 (BURR) ×4 IMPLANT
CAGE COROENT LRG 8X9X28M SPINE (Cage) ×8 IMPLANT
CANISTER SUCT 3000ML PPV (MISCELLANEOUS) ×4 IMPLANT
CARTRIDGE OIL MAESTRO DRILL (MISCELLANEOUS) ×2 IMPLANT
CNTNR URN SCR LID CUP LEK RST (MISCELLANEOUS) ×2 IMPLANT
CONT SPEC 4OZ STRL OR WHT (MISCELLANEOUS) ×4
COVER BACK TABLE 60X90IN (DRAPES) ×4 IMPLANT
DECANTER SPIKE VIAL GLASS SM (MISCELLANEOUS) ×4 IMPLANT
DERMABOND ADVANCED (GAUZE/BANDAGES/DRESSINGS) ×2
DERMABOND ADVANCED .7 DNX12 (GAUZE/BANDAGES/DRESSINGS) ×2 IMPLANT
DIFFUSER DRILL AIR PNEUMATIC (MISCELLANEOUS) ×4 IMPLANT
DRAPE C-ARM 42X72 X-RAY (DRAPES) ×4 IMPLANT
DRAPE C-ARMOR (DRAPES) ×4 IMPLANT
DRAPE LAPAROTOMY 100X72X124 (DRAPES) ×4 IMPLANT
DRAPE PULSE TABLE ARRAY (DRAPES) ×4 IMPLANT
DRAPE SURG 17X23 STRL (DRAPES) ×4 IMPLANT
DRSG OPSITE POSTOP 4X6 (GAUZE/BANDAGES/DRESSINGS) ×4 IMPLANT
DURAPREP 26ML APPLICATOR (WOUND CARE) ×4 IMPLANT
ELECT REM PT RETURN 9FT ADLT (ELECTROSURGICAL) ×4
ELECTRODE REM PT RTRN 9FT ADLT (ELECTROSURGICAL) ×2 IMPLANT
GAUZE 4X4 16PLY ~~LOC~~+RFID DBL (SPONGE) ×4 IMPLANT
GAUZE SPONGE 4X4 12PLY STRL (GAUZE/BANDAGES/DRESSINGS) ×4 IMPLANT
GLOVE EXAM NITRILE XL STR (GLOVE) IMPLANT
GLOVE SRG 8 PF TXTR STRL LF DI (GLOVE) ×4 IMPLANT
GLOVE SURG ENC MOIS LTX SZ8 (GLOVE) ×8 IMPLANT
GLOVE SURG LTX SZ8 (GLOVE) ×8 IMPLANT
GLOVE SURG UNDER POLY LF SZ8 (GLOVE) ×8
GLOVE SURG UNDER POLY LF SZ8.5 (GLOVE) ×8 IMPLANT
GOWN STRL REUS W/ TWL LRG LVL3 (GOWN DISPOSABLE) IMPLANT
GOWN STRL REUS W/ TWL XL LVL3 (GOWN DISPOSABLE) ×4 IMPLANT
GOWN STRL REUS W/TWL 2XL LVL3 (GOWN DISPOSABLE) ×8 IMPLANT
GOWN STRL REUS W/TWL LRG LVL3 (GOWN DISPOSABLE)
GOWN STRL REUS W/TWL XL LVL3 (GOWN DISPOSABLE) ×8
HEMOSTAT POWDER KIT SURGIFOAM (HEMOSTASIS) ×4 IMPLANT
KIT BASIN OR (CUSTOM PROCEDURE TRAY) ×4 IMPLANT
KIT INFUSE XX SMALL 0.7CC (Orthopedic Implant) ×4 IMPLANT
KIT POSITION SURG JACKSON T1 (MISCELLANEOUS) ×4 IMPLANT
KIT TURNOVER KIT B (KITS) ×4 IMPLANT
MILL MEDIUM DISP (BLADE) IMPLANT
NEEDLE HYPO 25X1 1.5 SAFETY (NEEDLE) ×4 IMPLANT
NEEDLE SPNL 18GX3.5 QUINCKE PK (NEEDLE) IMPLANT
NS IRRIG 1000ML POUR BTL (IV SOLUTION) ×4 IMPLANT
OIL CARTRIDGE MAESTRO DRILL (MISCELLANEOUS) ×4
PACK LAMINECTOMY NEURO (CUSTOM PROCEDURE TRAY) ×4 IMPLANT
PAD ARMBOARD 7.5X6 YLW CONV (MISCELLANEOUS) ×12 IMPLANT
PATTIES SURGICAL .5 X.5 (GAUZE/BANDAGES/DRESSINGS) IMPLANT
PATTIES SURGICAL .5 X1 (DISPOSABLE) IMPLANT
PATTIES SURGICAL 1X1 (DISPOSABLE) IMPLANT
ROD RELINE-O LORD 5.5X40 (Rod) ×8 IMPLANT
RUBBER BAND PULSE STRL (MISCELLANEOUS) ×4 IMPLANT
SCREW LOCK RELINE 5.5 TULIP (Screw) ×16 IMPLANT
SCREW RELINE-O POLY 7.5X45 (Screw) ×8 IMPLANT
SCREW RELINE-O POLY 7.5X50 (Screw) ×8 IMPLANT
SCREW RLINE PLY 2S 50X7.5XPA (Screw) ×4 IMPLANT
SPONGE SURGIFOAM ABS GEL 100 (HEMOSTASIS) ×4 IMPLANT
SPONGE T-LAP 4X18 ~~LOC~~+RFID (SPONGE) ×4 IMPLANT
STAPLER SKIN PROX WIDE 3.9 (STAPLE) IMPLANT
SUT VIC AB 1 CT1 18XBRD ANBCTR (SUTURE) ×4 IMPLANT
SUT VIC AB 1 CT1 8-18 (SUTURE) ×8
SUT VIC AB 2-0 CT1 18 (SUTURE) ×8 IMPLANT
SUT VIC AB 3-0 SH 8-18 (SUTURE) ×8 IMPLANT
TOWEL GREEN STERILE (TOWEL DISPOSABLE) ×4 IMPLANT
TOWEL GREEN STERILE FF (TOWEL DISPOSABLE) ×4 IMPLANT
TRAY FOLEY MTR SLVR 16FR STAT (SET/KITS/TRAYS/PACK) ×4 IMPLANT
WATER STERILE IRR 1000ML POUR (IV SOLUTION) ×4 IMPLANT

## 2021-01-08 NOTE — Interval H&P Note (Signed)
History and Physical Interval Note:  01/08/2021 11:41 AM  Jason Marquez  has presented today for surgery, with the diagnosis of Spondylolisthesis of lumbar region.  The various methods of treatment have been discussed with the patient and family. After consideration of risks, benefits and other options for treatment, the patient has consented to  Procedure(s): Lumbar 5 Sacral 1 Posterior lumbar interbody fusion (N/A) as a surgical intervention.  The patient's history has been reviewed, patient examined, no change in status, stable for surgery.  I have reviewed the patient's chart and labs.  Questions were answered to the patient's satisfaction.     Dorian Heckle

## 2021-01-08 NOTE — Anesthesia Preprocedure Evaluation (Addendum)
Anesthesia Evaluation  Patient identified by MRN, date of birth, ID band Patient awake    Reviewed: Allergy & Precautions, NPO status , Patient's Chart, lab work & pertinent test results  Airway Mallampati: III  TM Distance: >3 FB Neck ROM: Full    Dental  (+) Teeth Intact, Dental Advisory Given   Pulmonary    breath sounds clear to auscultation       Cardiovascular hypertension,  Rhythm:Regular Rate:Normal     Neuro/Psych    GI/Hepatic   Endo/Other    Renal/GU      Musculoskeletal   Abdominal   Peds  Hematology   Anesthesia Other Findings   Reproductive/Obstetrics                             Anesthesia Physical Anesthesia Plan  ASA: 3  Anesthesia Plan: General   Post-op Pain Management:    Induction: Intravenous  PONV Risk Score and Plan: Ondansetron and Dexamethasone  Airway Management Planned: Oral ETT  Additional Equipment:   Intra-op Plan:   Post-operative Plan: Extubation in OR  Informed Consent: I have reviewed the patients History and Physical, chart, labs and discussed the procedure including the risks, benefits and alternatives for the proposed anesthesia with the patient or authorized representative who has indicated his/her understanding and acceptance.     Dental advisory given  Plan Discussed with: CRNA and Anesthesiologist  Anesthesia Plan Comments:         Anesthesia Quick Evaluation

## 2021-01-08 NOTE — Plan of Care (Signed)
  Problem: Education: Goal: Ability to verbalize activity precautions or restrictions will improve Outcome: Progressing   

## 2021-01-08 NOTE — Transfer of Care (Signed)
Immediate Anesthesia Transfer of Care Note  Patient: Jason Marquez  Procedure(s) Performed: LUMBAR FIVE-SACRAL ONE POSTERIOR LUMBAR INTERBODY FUSION (Spine Lumbar) APPLICATION OF PULSE  Patient Location: PACU  Anesthesia Type:General  Level of Consciousness: drowsy  Airway & Oxygen Therapy: Patient Spontanous Breathing  Post-op Assessment: Report given to RN and Post -op Vital signs reviewed and stable  Post vital signs: Reviewed and stable  Last Vitals:  Vitals Value Taken Time  BP 96/41 01/08/21 1524  Temp    Pulse 74 01/08/21 1526  Resp 15 01/08/21 1526  SpO2 100 % 01/08/21 1526  Vitals shown include unvalidated device data.  Last Pain:  Vitals:   01/08/21 0953  TempSrc:   PainSc: 0-No pain         Complications: No notable events documented.

## 2021-01-08 NOTE — Anesthesia Postprocedure Evaluation (Signed)
Anesthesia Post Note  Patient: Jason Marquez  Procedure(s) Performed: LUMBAR FIVE-SACRAL ONE POSTERIOR LUMBAR INTERBODY FUSION (Spine Lumbar) APPLICATION OF PULSE     Patient location during evaluation: PACU Anesthesia Type: General Level of consciousness: awake and alert Pain management: pain level controlled Vital Signs Assessment: post-procedure vital signs reviewed and stable Respiratory status: spontaneous breathing, nonlabored ventilation and respiratory function stable Cardiovascular status: blood pressure returned to baseline and stable Postop Assessment: no apparent nausea or vomiting Anesthetic complications: no   No notable events documented.  Last Vitals:  Vitals:   01/08/21 1624 01/08/21 1630  BP: (!) 96/35 122/75  Pulse: 64 65  Resp: 14 12  Temp:    SpO2: 96% 98%    Last Pain:  Vitals:   01/08/21 1554  TempSrc:   PainSc: 9                  Lowella Curb

## 2021-01-08 NOTE — Progress Notes (Signed)
Awake, alert, conversant.  Full bilateral PF/DF/EHL strength.  No leg pain or numbness.  Sore in back.  Doing well.

## 2021-01-08 NOTE — Op Note (Signed)
01/08/2021  3:00 PM  PATIENT:  Jason Marquez  55 y.o. male  PRE-OPERATIVE DIAGNOSIS:  Spondylolisthesis of lumbar region, spondylolysis, stenosis, lumbago, radiculopathy L 5 S 1   POST-OPERATIVE DIAGNOSIS:  Spondylolisthesis of lumbar region, spondylolysis, stenosis, lumbago, radiculopathy L 5 S 1   PROCEDURE:  Procedure(s): Lumbar 5 Sacral 1 Posterior lumbar interbody fusion (N/A) APPLICATION OF PULSE with L 5 Gill, L 5 S 1 PLIF with PEEK cages, autograft, pedicle screw fixation, posterolateral arthrodesis  SURGEON:  Surgeon(s) and Role:    Maeola Harman, MD - Primary  PHYSICIAN ASSISTANT: Julien Girt, NP  ASSISTANTS: Poteat, RN   ANESTHESIA:   general  EBL:  200 mL   BLOOD ADMINISTERED:none  DRAINS: none   LOCAL MEDICATIONS USED:  MARCAINE    and LIDOCAINE   SPECIMEN:  No Specimen  DISPOSITION OF SPECIMEN:  N/A  COUNTS:  YES  TOURNIQUET:  * No tourniquets in log *  DICTATION: Patient is 55 year old man with mobile spondylolisthesis of L 5 on S 1 with lumbar stenosis and L 5 spondylolysis.  It was elected to take him to surgery for decompression and fusion at this level.   Procedure: Patient was placed in a prone position on the [place] table after smooth and uncomplicated induction of general endotracheal anesthesia. His low back was prepped and draped in usual sterile fashion with betadine scrub and DuraPrep after registering bony anatomy with Carm and Pulse. Area of incision was infiltrated with local lidocaine. Incision was made to the lumbodorsal fascia was incised and exposure was performed of the L 5  through S 1 spinous processes laminae facet joint and transverse processes. Intraoperative x-ray was obtained which confirmed correct orientation. A total laminectomy of L 5 (Gill procedure) was performed with disarticulation of the facet joints at this level and thorough decompression was performed of both L 5  and S 1 nerve roots along with the common dural tube. This  decompression was more involved than would be typical of that performed for PLIF alone and included painstaking dissection of adherent ligament compressing the thecal sac and wide decompression of all neural elements. The posterior elements of L 5 were hyper-mobile due to bilateral pars defects.  A thorough discectomy was initially performed on the left with preparation of the endplates for grafting a trial spacer was placed this level and a thorough discectomy was performed on the right as well. Bilateral 8 x 9 x 28 mm x 8 degree peek cages were packed with extra extra small BMP and autograft and was inserted the interspace and countersunk appropriately along with 10 cc of morselized bone autograft medial to the second cage. The posterolateral region was extensively decorticated and pedicle probes were placed at L 5 and S 1 bilaterally. Intraoperative fluoroscopy confirmed correct orientationin the AP and lateral plane. 45 x 7.5 mm pedicle screws were placed at S 1 bilaterally and 50 x 7.5 mm screws placed at L 5 bilaterally final x-rays demonstrated well-positioned interbody grafts and pedicle screw fixation. A 40 mm lordotic rod was placed on the right and a 40 mm rod was placed on the left locked down in situ and the posterolateral region was packed with the remaining BMP and 15 cc autograft on the right in the posterolateral region. The wound was irrigated and long-acting Marcaine was injected in the deep musculature. Fascia was closed with 1 Vicryl sutures skin edges were reapproximated 2 and 3-0 Vicryl sutures. The wound is dressed with Dermabond and an occlusive dressing.  The patient was extubated in the operating room and taken to recovery in stable satisfactory condition. He tolerated the operation well counts were correct at the end of the case.   PLAN OF CARE: Admit to inpatient   PATIENT DISPOSITION:  PACU - hemodynamically stable.   Delay start of Pharmacological VTE agent (>24hrs) due to  surgical blood loss or risk of bleeding: yes

## 2021-01-08 NOTE — Anesthesia Procedure Notes (Signed)
Procedure Name: Intubation Date/Time: 01/08/2021 12:46 PM Performed by: Lynnell Chad, CRNA Pre-anesthesia Checklist: Patient identified, Emergency Drugs available, Suction available and Patient being monitored Patient Re-evaluated:Patient Re-evaluated prior to induction Oxygen Delivery Method: Circle System Utilized Preoxygenation: Pre-oxygenation with 100% oxygen Induction Type: IV induction Ventilation: Mask ventilation without difficulty Grade View: Grade I Tube type: Oral Tube size: 7.5 mm Number of attempts: 1 Airway Equipment and Method: Stylet and Oral airway Placement Confirmation: ETT inserted through vocal cords under direct vision, positive ETCO2 and breath sounds checked- equal and bilateral Secured at: 22 cm Tube secured with: Tape Dental Injury: Teeth and Oropharynx as per pre-operative assessment

## 2021-01-08 NOTE — Brief Op Note (Signed)
01/08/2021  3:00 PM  PATIENT:  Jason Marquez  55 y.o. male  PRE-OPERATIVE DIAGNOSIS:  Spondylolisthesis of lumbar region, spondylolysis, stenosis, lumbago, radiculopathy L 5 S 1   POST-OPERATIVE DIAGNOSIS:  Spondylolisthesis of lumbar region, spondylolysis, stenosis, lumbago, radiculopathy L 5 S 1   PROCEDURE:  Procedure(s): Lumbar 5 Sacral 1 Posterior lumbar interbody fusion (N/A) APPLICATION OF PULSE with L 5 Gill, L 5 S 1 PLIF with PEEK cages, autograft, pedicle screw fixation, posterolateral arthrodesis  SURGEON:  Surgeon(s) and Role:    * Aloys Hupfer, MD - Primary  PHYSICIAN ASSISTANT: McDaniel, NP  ASSISTANTS: Poteat, RN   ANESTHESIA:   general  EBL:  200 mL   BLOOD ADMINISTERED:none  DRAINS: none   LOCAL MEDICATIONS USED:  MARCAINE    and LIDOCAINE   SPECIMEN:  No Specimen  DISPOSITION OF SPECIMEN:  N/A  COUNTS:  YES  TOURNIQUET:  * No tourniquets in log *  DICTATION: Patient is 55-year-old man with mobile spondylolisthesis of L 5 on S 1 with lumbar stenosis and L 5 spondylolysis.  It was elected to take him to surgery for decompression and fusion at this level.   Procedure: Patient was placed in a prone position on the [place] table after smooth and uncomplicated induction of general endotracheal anesthesia. His low back was prepped and draped in usual sterile fashion with betadine scrub and DuraPrep after registering bony anatomy with Carm and Pulse. Area of incision was infiltrated with local lidocaine. Incision was made to the lumbodorsal fascia was incised and exposure was performed of the L 5  through S 1 spinous processes laminae facet joint and transverse processes. Intraoperative x-ray was obtained which confirmed correct orientation. A total laminectomy of L 5 (Gill procedure) was performed with disarticulation of the facet joints at this level and thorough decompression was performed of both L 5  and S 1 nerve roots along with the common dural tube. This  decompression was more involved than would be typical of that performed for PLIF alone and included painstaking dissection of adherent ligament compressing the thecal sac and wide decompression of all neural elements. The posterior elements of L 5 were hyper-mobile due to bilateral pars defects.  A thorough discectomy was initially performed on the left with preparation of the endplates for grafting a trial spacer was placed this level and a thorough discectomy was performed on the right as well. Bilateral 8 x 9 x 28 mm x 8 degree peek cages were packed with extra extra small BMP and autograft and was inserted the interspace and countersunk appropriately along with 10 cc of morselized bone autograft medial to the second cage. The posterolateral region was extensively decorticated and pedicle probes were placed at L 5 and S 1 bilaterally. Intraoperative fluoroscopy confirmed correct orientationin the AP and lateral plane. 45 x 7.5 mm pedicle screws were placed at S 1 bilaterally and 50 x 7.5 mm screws placed at L 5 bilaterally final x-rays demonstrated well-positioned interbody grafts and pedicle screw fixation. A 40 mm lordotic rod was placed on the right and a 40 mm rod was placed on the left locked down in situ and the posterolateral region was packed with the remaining BMP and 15 cc autograft on the right in the posterolateral region. The wound was irrigated and long-acting Marcaine was injected in the deep musculature. Fascia was closed with 1 Vicryl sutures skin edges were reapproximated 2 and 3-0 Vicryl sutures. The wound is dressed with Dermabond and an occlusive dressing.    The patient was extubated in the operating room and taken to recovery in stable satisfactory condition. He tolerated the operation well counts were correct at the end of the case.   PLAN OF CARE: Admit to inpatient   PATIENT DISPOSITION:  PACU - hemodynamically stable.   Delay start of Pharmacological VTE agent (>24hrs) due to  surgical blood loss or risk of bleeding: yes

## 2021-01-09 ENCOUNTER — Other Ambulatory Visit: Payer: Self-pay

## 2021-01-09 MED ORDER — OXYCODONE-ACETAMINOPHEN 5-325 MG PO TABS: 1.0000 | ORAL_TABLET | ORAL | 0 refills | Status: AC | PRN

## 2021-01-09 MED ORDER — METHOCARBAMOL 500 MG PO TABS: 500.0000 mg | ORAL_TABLET | Freq: Four times a day (QID) | ORAL | 0 refills | Status: AC | PRN

## 2021-01-09 MED FILL — Thrombin For Soln 20000 Unit: CUTANEOUS | Qty: 1 | Status: AC

## 2021-01-09 MED FILL — Thrombin For Soln Kit 20000 Unit: CUTANEOUS | Qty: 1 | Status: AC

## 2021-01-09 NOTE — Discharge Summary (Signed)
Physician Discharge Summary  Patient ID: Jason Marquez MRN: [id number] DOB/AGE: 1965/12/09 55 y.o.  Admit date: 01/08/2021 Discharge date: 01/09/2021  Admission Diagnoses: Spondylolisthesis of lumbar region, spondylolysis, stenosis, lumbago, radiculopathy L 5 S 1   Discharge Diagnoses: Spondylolisthesis of lumbar region, spondylolysis, stenosis, lumbago, radiculopathy L 5 S 1  Active Problems:   Spondylolysis, lumbar region   Discharged Condition: good  Hospital Course: The patient was admitted on 01/08/2021 and taken to the operating room where the patient underwent L5/S1 PLIF. The patient tolerated the procedure well and was taken to the recovery room and then to the floor in stable condition. The hospital course was routine. There were no complications. The wound remained clean dry and intact. Pt had appropriate back soreness. No complaints of leg pain or new N/T/W. The patient remained afebrile with stable vital signs, and tolerated a regular diet. The patient continued to increase activities, and pain was well controlled with oral pain medications.   Consults: None  Significant Diagnostic Studies: radiology: X-Ray: intraoperative  Treatments: surgery: Lumbar 5 Sacral 1 Posterior lumbar interbody fusion (N/A) APPLICATION OF PULSE with L 5 Gill, L 5 S 1 PLIF with PEEK cages, autograft, pedicle screw fixation, posterolateral arthrodesis  Discharge Exam: Blood pressure 109/66, pulse 73, temperature 97.7 F (36.5 C), temperature source Oral, resp. rate 18, height 5\' 4"  (1.626 m), weight 83.9 kg, SpO2 94 %.  Physical Exam: Patient is awake, A/O X 4, conversant, and in good spirits. Speech is fluent and appropriate. Doing well. MAEW with good strength that is symmetric bilaterally. 5/5 BUE/BLE.   Dressing is clean dry intact. Incision is well approximated with no drainage, erythema, or edema.     Disposition: Discharge disposition: 01-Home or Self Care        Allergies as of  01/09/2021       Reactions   Psyllium    Abd. cramps   Naproxen    Fatty Liver   Other    Walnuts abdominal cramping    Lac Bovis Nausea Only   Abdominal cramps        Medication List     STOP taking these medications    traMADol 50 MG tablet Commonly known as: ULTRAM       TAKE these medications    atorvastatin 10 MG tablet Commonly known as: LIPITOR Take 10 mg by mouth daily.   losartan 25 MG tablet Commonly known as: COZAAR Take 25 mg by mouth daily.   meclizine 25 MG tablet Commonly known as: ANTIVERT Take 25 mg by mouth daily.   methocarbamol 500 MG tablet Commonly known as: ROBAXIN Take 1 tablet (500 mg total) by mouth every 6 (six) hours as needed for muscle spasms.   omeprazole 20 MG capsule Commonly known as: PRILOSEC Take 20 mg by mouth daily.   oxyCODONE-acetaminophen 5-325 MG tablet Commonly known as: Percocet Take 1-2 tablets by mouth every 4 (four) hours as needed for severe pain.         Signed: 03/12/2021, DNP, NP-C 01/09/2021, 8:57 AM

## 2021-01-09 NOTE — Evaluation (Signed)
Physical Therapy Evaluation and Discharge Patient Details Name: Jason Marquez MRN: [id number] DOB: 09-28-1965 Today's Date: 01/09/2021   History of Present Illness  Pt is a 55 y/o male who presents s/p L5-S1 PLIF on 01/08/2021. PMH significant for chronic low back pain and intermittent radiculopathy.  Clinical Impression  Patient evaluated by Physical Therapy with no further acute PT needs identified. All education has been completed and the patient has no further questions. Pt was able to demonstrate transfers and ambulation with gross modified independence and no AD. Pt was educated on precautions, brace application/wearing schedule, appropriate activity progression, and car transfer. See below for any follow-up Physical Therapy or equipment needs. PT is signing off. Thank you for this referral.     Follow Up Recommendations No PT follow up;Supervision for mobility/OOB    Equipment Recommendations  None recommended by PT    Recommendations for Other Services       Precautions / Restrictions Precautions Precautions: Back Precaution Booklet Issued: Yes (comment) Precaution Comments: Reviewed handout and pt was cued for precautions during functional mobility. Required Braces or Orthoses: Spinal Brace Spinal Brace: Lumbar corset;Applied in sitting position Restrictions Weight Bearing Restrictions: No      Mobility  Bed Mobility Overal bed mobility: Modified Independent             General bed mobility comments: Good log roll technique with HOB slightly elevated.    Transfers Overall transfer level: Modified independent Equipment used: None             General transfer comment: No assist to power-up to full stand. No unsteadiness or LOB noted.  Ambulation/Gait Ambulation/Gait assistance: Modified independent (Device/Increase time) Gait Distance (Feet): 400 Feet Assistive device: None Gait Pattern/deviations: Step-through pattern;Decreased stride length;Trunk  flexed Gait velocity: Decreased Gait velocity interpretation: <1.31 ft/sec, indicative of household ambulator General Gait Details: VC's for improved posture and pt able to maintain precautions well.  Stairs Stairs: Yes Stairs assistance: Modified independent (Device/Increase time) Stair Management: One rail Right;Forwards;Step to pattern Number of Stairs: 5 General stair comments: VC's for general safety techniques. Pt completed without difficulty.  Wheelchair Mobility    Modified Rankin (Stroke Patients Only)       Balance Overall balance assessment: Mild deficits observed, not formally tested                                           Pertinent Vitals/Pain Pain Assessment: Faces Faces Pain Scale: Hurts little more Pain Location: back Pain Descriptors / Indicators: Discomfort;Guarding Pain Intervention(s): Limited activity within patient's tolerance;Monitored during session;Repositioned    Home Living Family/patient expects to be discharged to:: Private residence Living Arrangements: Spouse/significant other;Children Available Help at Discharge: Family Type of Home: House Home Access: Stairs to enter Entrance Stairs-Rails: Doctor, general practice of Steps: 4 Home Layout: One level Home Equipment: Shower seat - built in;Other (comment) (Can elevate HOB)      Prior Function Level of Independence: Independent         Comments: Pt reporting wife and children can assist if needed     Hand Dominance   Dominant Hand: Right    Extremity/Trunk Assessment   Upper Extremity Assessment Upper Extremity Assessment: Defer to OT evaluation    Lower Extremity Assessment Lower Extremity Assessment: Overall WFL for tasks assessed    Cervical / Trunk Assessment Cervical / Trunk Assessment: Other exceptions Cervical / Trunk  Exceptions: s/p surgery  Communication   Communication: No difficulties  Cognition Arousal/Alertness:  Awake/alert Behavior During Therapy: WFL for tasks assessed/performed Overall Cognitive Status: Within Functional Limits for tasks assessed                                 General Comments: Pt pleasant and conversational throughout session. Able to recall back precautions upon therapist entry into room.      General Comments General comments (skin integrity, edema, etc.): Pt reporting wife and kids can assist with donning socks and shoes if needed.    Exercises     Assessment/Plan    PT Assessment Patent does not need any further PT services  PT Problem List         PT Treatment Interventions      PT Goals (Current goals can be found in the Care Plan section)  Acute Rehab PT Goals Patient Stated Goal: no pain PT Goal Formulation: All assessment and education complete, DC therapy    Frequency     Barriers to discharge        Co-evaluation               AM-PAC PT "6 Clicks" Mobility  Outcome Measure Help needed turning from your back to your side while in a flat bed without using bedrails?: None Help needed moving from lying on your back to sitting on the side of a flat bed without using bedrails?: None Help needed moving to and from a bed to a chair (including a wheelchair)?: None Help needed standing up from a chair using your arms (e.g., wheelchair or bedside chair)?: None Help needed to walk in hospital room?: None Help needed climbing 3-5 steps with a railing? : A Little 6 Click Score: 23    End of Session Equipment Utilized During Treatment: Back brace Activity Tolerance: Patient tolerated treatment well Patient left: in bed;with call bell/phone within reach Nurse Communication: Mobility status PT Visit Diagnosis: Unsteadiness on feet (R26.81);Pain Pain - part of body:  (back)    Time: 0950-1001 PT Time Calculation (min) (ACUTE ONLY): 11 min   Charges:   PT Evaluation $PT Eval Low Complexity: 1 Low          Jason Marquez, PT,  DPT Acute Rehabilitation Services Pager: [phone] Office: [phone]   Jason Marquez 01/09/2021, 11:22 AM

## 2021-01-09 NOTE — Discharge Instructions (Addendum)

## 2021-01-09 NOTE — Progress Notes (Signed)
Patient is discharged from room 3C08 at this time. Alert and in stable condition. IV site d/c'd and instructions read to patient with understanding verbalized and all questions answered. Left unit via wheelchair with all belongings at side. 

## 2021-01-09 NOTE — Progress Notes (Signed)
Orthopedic Tech Progress Note Patient Details:  Jason Marquez Feb 07, 1966 [id number] Applied LSO  Ortho Devices Type of Ortho Device: Other (comment) Ortho Device/Splint Interventions: Ordered, Application   Post Interventions Patient Tolerated: Well Instructions Provided: Adjustment of device  Jason Marquez 01/09/2021, 9:01 AM

## 2021-01-09 NOTE — Progress Notes (Signed)
Subjective: Patient reports that he is doing well and is pleased with his postoperative status. He has had a significant reduction in his preoperative symptoms and has ambulated multiple times in the hallway with little difficulty. No acute events overnight. His only complaint is of appropriate incisional pain.   Objective: Vital signs in last 24 hours: Temp:  [97.5 F (36.4 C)-98.5 F (36.9 C)] 97.7 F (36.5 C) (07/01 0734) Pulse Rate:  [63-88] 73 (07/01 0734) Resp:  [12-18] 18 (07/01 0734) BP: (91-125)/(34-75) 109/66 (07/01 0734) SpO2:  [94 %-100 %] 94 % (07/01 0734) FiO2 (%):  [96 %] 96 % (07/01 0734) Weight:  [83.9 kg] 83.9 kg (06/30 0914)  Intake/Output from previous day: 06/30 0701 - 07/01 0700 In: 950 [I.V.:950] Out: 400 [Urine:200; Blood:200] Intake/Output this shift: No intake/output data recorded.  Physical Exam: Patient is awake, A/O X 4, conversant, and in good spirits. Speech is fluent and appropriate. Doing well. MAEW with good strength that is symmetric bilaterally. 5/5 BUE/BLE.   Dressing is clean dry intact. Incision is well approximated with no drainage, erythema, or edema.    Lab Results: Recent Labs    01/06/21 0830  WBC 9.3  HGB 14.6  HCT 42.9  PLT 193   BMET Recent Labs    01/06/21 0830  NA 139  K 3.6  CL 101  CO2 26  GLUCOSE 86  BUN 18  CREATININE 0.92  CALCIUM 8.9    Studies/Results: DG Lumbar Spine 2-3 Views  Result Date: 01/08/2021 CLINICAL DATA:  Elective surgery. Additional history provided: Lumbar 5 sacral 1 posterior lumbar interbody fusion. Provided fluoroscopy time 41 seconds (16.06 mGy). EXAM: LUMBAR SPINE - 2-3 VIEW; DG C-ARM 1-60 MIN COMPARISON:  Radiographs of the lumbar spine 12/10/2020. Lumbar spine MRI 08/28/2020. FINDINGS: AP and lateral view intraoperative fluoroscopic images of the lumbosacral spine are submitted, 3 images total. The lowest well-formed intervertebral disc space is designated L5-S1. On the provided  images, bilateral pedicle screws are present at L5 and S1. Vertical interconnecting rods were not present at the time the images were taken. Interbody device(s) also present at L5-S1. Overlying retractors. IMPRESSION: Three intraoperative fluoroscopic images of the lumbosacral spine from L5-S1 posterior fusion, as described. Electronically Signed   By: Jackey Loge DO   On: 01/08/2021 15:17   DG C-Arm 1-60 Min  Result Date: 01/08/2021 CLINICAL DATA:  Elective surgery. Additional history provided: Lumbar 5 sacral 1 posterior lumbar interbody fusion. Provided fluoroscopy time 41 seconds (16.06 mGy). EXAM: LUMBAR SPINE - 2-3 VIEW; DG C-ARM 1-60 MIN COMPARISON:  Radiographs of the lumbar spine 12/10/2020. Lumbar spine MRI 08/28/2020. FINDINGS: AP and lateral view intraoperative fluoroscopic images of the lumbosacral spine are submitted, 3 images total. The lowest well-formed intervertebral disc space is designated L5-S1. On the provided images, bilateral pedicle screws are present at L5 and S1. Vertical interconnecting rods were not present at the time the images were taken. Interbody device(s) also present at L5-S1. Overlying retractors. IMPRESSION: Three intraoperative fluoroscopic images of the lumbosacral spine from L5-S1 posterior fusion, as described. Electronically Signed   By: Jackey Loge DO   On: 01/08/2021 15:17    Assessment/Plan: Patient is post-op day 1 s/p L5/S1 PLIF. He is recovering well and reports a resolution of his preoperative symptoms. His only complaint is mild incisional discomfort. He has ambulated with nursing staff and is awaiting PT/OT evaluation.  Continue LSO brace when OOB. Continue working on pain control, mobility and ambulating patient. Will plan for discharge today.  LOS: 1 day     Council Mechanic, DNP, NP-C 01/09/2021, 7:56 AM

## 2021-01-09 NOTE — Evaluation (Signed)
Occupational Therapy Evaluation/Discharge  Patient Details Name: Jason Marquez MRN: [id number] DOB: 07-03-1966 Today's Date: 01/09/2021    History of Present Illness 55 y.o. male s/p L5-S1 posterior interbody fusion. PMH significant for chronic low back pain and intermittent radiculopathy.   Clinical Impression   PTA, pt was independent and lived with his wife and kids; was also working. Currently, pt performing all ADLs with mod I. Pt educated and demonstrating use of compensatory techniques for LB dressing, toileting, oral care, and shower transfers. Pt recalling 3/3 spinal precautions. All questions answered. Recommend discharge home with no follow up OT. Re-consult if change in status. OT to sign off.     Follow Up Recommendations  No OT follow up    Equipment Recommendations  None recommended by OT    Recommendations for Other Services       Precautions / Restrictions Precautions Precautions: Back Precaution Booklet Issued: Yes (comment) Precaution Comments: all education reviewed. Brace not in room. Required Braces or Orthoses: Spinal Brace Spinal Brace: Lumbar corset Restrictions Weight Bearing Restrictions: No (Simultaneous filing. User may not have seen previous data.)      Mobility Bed Mobility Overal bed mobility: Modified Independent             General bed mobility comments: Pt educated and demonstrating use of log roll technique for bed mobility with mod I.    Transfers Overall transfer level: Modified independent Equipment used: None             General transfer comment: Pt performing transfers with mod I for increased time.    Balance Overall balance assessment: Mild deficits observed, not formally tested                                         ADL either performed or assessed with clinical judgement   ADL Overall ADL's : Modified independent                                       General ADL Comments: Pt  educated and demonstrating compensatory techniques for UB dressing, LB dressing, oral care, shower transfer, and toileting with mod I for increased time. Reviewed brace application, however, no brace present.     Vision   Vision Assessment?: No apparent visual deficits     Perception Perception Perception Tested?: No Comments: no apparent difficulty with perception   Praxis Praxis Praxis tested?: Not tested Praxis-Other Comments: no apparent difficulty with motor planning.    Pertinent Vitals/Pain Pain Assessment: Faces Faces Pain Scale: Hurts little more Pain Location: back Pain Descriptors / Indicators: Discomfort;Guarding Pain Intervention(s): Monitored during session     Hand Dominance Right   Extremity/Trunk Assessment Upper Extremity Assessment Upper Extremity Assessment: Generalized weakness   Lower Extremity Assessment Lower Extremity Assessment: Defer to PT evaluation   Cervical / Trunk Assessment Cervical / Trunk Assessment: Other exceptions Cervical / Trunk Exceptions: s/p Lumbar spinal surgery   Communication Communication Communication: No difficulties   Cognition Arousal/Alertness: Awake/alert Behavior During Therapy: WFL for tasks assessed/performed Overall Cognitive Status: Within Functional Limits for tasks assessed                                 General Comments: Pt pleasant and conversational  throughout session. Able to recall back precautions upon therapist entry into room.   General Comments  Pt reporting wife and kids can assist with donning socks and shoes if needed.    Exercises     Shoulder Instructions      Home Living Family/patient expects to be discharged to:: Private residence Living Arrangements: Spouse/significant other;Children Available Help at Discharge: Family Type of Home: House Home Access: Stairs to enter Secretary/administrator of Steps: 4 Entrance Stairs-Rails: Right;Left Home Layout: One level      Bathroom Shower/Tub: Producer, television/film/video: Standard     Home Equipment: Shower seat - built in;Other (comment) (Can elevate HOB)          Prior Functioning/Environment Level of Independence: Independent        Comments: Pt reporting wife and children can assist if needed        OT Problem List: Decreased strength;Decreased activity tolerance;Impaired balance (sitting and/or standing);Pain      OT Treatment/Interventions:      OT Goals(Current goals can be found in the care plan section) Acute Rehab OT Goals Patient Stated Goal: no pain OT Goal Formulation: With patient  OT Frequency:     Barriers to D/C:            Co-evaluation              AM-PAC OT "6 Clicks" Daily Activity     Outcome Measure Help from another person eating meals?: None Help from another person taking care of personal grooming?: None Help from another person toileting, which includes using toliet, bedpan, or urinal?: None Help from another person bathing (including washing, rinsing, drying)?: None Help from another person to put on and taking off regular upper body clothing?: None Help from another person to put on and taking off regular lower body clothing?: None 6 Click Score: 24   End of Session Nurse Communication: Mobility status  Activity Tolerance: Patient tolerated treatment well Patient left: Other (comment) (EOB)  OT Visit Diagnosis: Unsteadiness on feet (R26.81);Muscle weakness (generalized) (M62.81);Pain Pain - part of body:  (back)                Time: 0488-8916 OT Time Calculation (min): 17 min Charges:  OT General Charges $OT Visit: 1 Visit OT Evaluation $OT Eval Low Complexity: 1 Low  Ladene Artist, OTDS   Ladene Artist 01/09/2021, 9:06 AM

## 2024-08-04 ENCOUNTER — Encounter (HOSPITAL_BASED_OUTPATIENT_CLINIC_OR_DEPARTMENT_OTHER): Payer: Self-pay

## 2024-08-04 ENCOUNTER — Other Ambulatory Visit: Payer: Self-pay

## 2024-08-04 ENCOUNTER — Emergency Department (HOSPITAL_BASED_OUTPATIENT_CLINIC_OR_DEPARTMENT_OTHER): Payer: PRIVATE HEALTH INSURANCE

## 2024-08-04 ENCOUNTER — Emergency Department (HOSPITAL_BASED_OUTPATIENT_CLINIC_OR_DEPARTMENT_OTHER)
Admission: EM | Admit: 2024-08-04 | Discharge: 2024-08-05 | Disposition: A | Payer: PRIVATE HEALTH INSURANCE | Attending: Emergency Medicine | Admitting: Emergency Medicine

## 2024-08-04 DIAGNOSIS — R197 Diarrhea, unspecified: Secondary | ICD-10-CM | POA: Diagnosis present

## 2024-08-04 DIAGNOSIS — K529 Noninfective gastroenteritis and colitis, unspecified: Secondary | ICD-10-CM | POA: Diagnosis not present

## 2024-08-04 DIAGNOSIS — Z79899 Other long term (current) drug therapy: Secondary | ICD-10-CM | POA: Insufficient documentation

## 2024-08-04 DIAGNOSIS — I10 Essential (primary) hypertension: Secondary | ICD-10-CM | POA: Insufficient documentation

## 2024-08-04 LAB — COMPREHENSIVE METABOLIC PANEL WITH GFR
ALT: 35 U/L (ref 0–44)
AST: 24 U/L (ref 15–41)
Albumin: 4.3 g/dL (ref 3.5–5.0)
Alkaline Phosphatase: 100 U/L (ref 38–126)
Anion gap: 12 (ref 5–15)
BUN: 28 mg/dL — ABNORMAL HIGH (ref 6–20)
CO2: 24 mmol/L (ref 22–32)
Calcium: 8.6 mg/dL — ABNORMAL LOW (ref 8.9–10.3)
Chloride: 104 mmol/L (ref 98–111)
Creatinine, Ser: 1.25 mg/dL — ABNORMAL HIGH (ref 0.61–1.24)
GFR, Estimated: >60 mL/min
Glucose, Bld: 129 mg/dL — ABNORMAL HIGH (ref 70–99)
Potassium: 3.7 mmol/L (ref 3.5–5.1)
Sodium: 139 mmol/L (ref 135–145)
Total Bilirubin: 0.8 mg/dL (ref 0.0–1.2)
Total Protein: 7 g/dL (ref 6.5–8.1)

## 2024-08-04 LAB — CBC
HCT: 41.7 % (ref 39.0–52.0)
Hemoglobin: 14.9 g/dL (ref 13.0–17.0)
MCH: 28.4 pg (ref 26.0–34.0)
MCHC: 35.7 g/dL (ref 30.0–36.0)
MCV: 79.6 fL — ABNORMAL LOW (ref 80.0–100.0)
Platelets: 217 10*3/uL (ref 150–400)
RBC: 5.24 MIL/uL (ref 4.22–5.81)
RDW: 13.2 % (ref 11.5–15.5)
WBC: 15.4 10*3/uL — ABNORMAL HIGH (ref 4.0–10.5)
nRBC: 0 % (ref 0.0–0.2)

## 2024-08-04 LAB — LIPASE, BLOOD: Lipase: 43 U/L (ref 11–51)

## 2024-08-04 MED ORDER — IOHEXOL 300 MG/ML  SOLN
100.0000 mL | Freq: Once | INTRAMUSCULAR | Status: AC | PRN
  Administered 2024-08-05: 100 mL via INTRAVENOUS

## 2024-08-04 MED ORDER — FENTANYL CITRATE (PF) 50 MCG/ML IJ SOSY
50.0000 ug | PREFILLED_SYRINGE | Freq: Once | INTRAMUSCULAR | Status: AC
  Administered 2024-08-04: 50 ug via INTRAVENOUS
  Filled 2024-08-04: qty 1

## 2024-08-04 MED ORDER — SODIUM CHLORIDE 0.9 % IV BOLUS
1000.0000 mL | Freq: Once | INTRAVENOUS | Status: AC
  Administered 2024-08-04: 1000 mL via INTRAVENOUS

## 2024-08-04 MED ORDER — ONDANSETRON HCL 4 MG/2ML IJ SOLN
4.0000 mg | Freq: Once | INTRAMUSCULAR | Status: AC
  Administered 2024-08-04: 4 mg via INTRAVENOUS
  Filled 2024-08-04: qty 2

## 2024-08-04 NOTE — ED Notes (Signed)
Patient transported for imaging.

## 2024-08-04 NOTE — ED Provider Notes (Signed)
 " [facility] Provider Note   CSN: [id number] Arrival date & time: 08/04/24  2257     Patient presents with: Abdominal Pain and Emesis   Jason Marquez is a 59 y.o. male.  {Add pertinent medical, surgical, social history, OB history to HPI:32947}  Abdominal Pain Associated symptoms: vomiting   Emesis Associated symptoms: abdominal pain      59 year old male with medical history significant for HTN, GERD presenting to the emergency department with a chief complaint of nausea, vomiting, diarrhea.  The patient states that he overexerted himself yesterday helping his son move.  Today he had multiple episodes of nonbloody emesis, does describe some bile with dark green material in his emesis earlier today.  He also has had watery stools, denies any blood in his stool.  He has had generalized abdominal cramping-like discomfort, currently described as 5 out of 10 in severity, also earlier today had an episode of chest discomfort described as a heaviness/tightness in his chest.  Presents primarily in the setting of his abdominal pain.  Prior to Admission medications  Medication Sig Start Date End Date Taking? Authorizing Provider  atorvastatin  (LIPITOR) 10 MG tablet Take 10 mg by mouth daily. 08/04/20   [provider]  losartan  (COZAAR ) 25 MG tablet Take 25 mg by mouth daily. 11/13/20   [provider]  meclizine  (ANTIVERT ) 25 MG tablet Take 25 mg by mouth daily. 08/12/14   [provider]  methocarbamol  (ROBAXIN ) 500 MG tablet Take 1 tablet (500 mg total) by mouth every 6 (six) hours as needed for muscle spasms. 01/09/21   Arvil Fonda CROME, NP  omeprazole (PRILOSEC) 20 MG capsule Take 20 mg by mouth daily.    [provider]    Allergies: Psyllium, Naproxen, Other, and Milk (cow)    Review of Systems  Gastrointestinal:  Positive for abdominal pain and vomiting.  All other systems reviewed and are  negative.   Updated Vital Signs BP 124/80   Pulse 72   Temp 98.7 F (37.1 C) (Oral)   Resp 18   Ht 5' 4 (1.626 m)   Wt 83.9 kg   SpO2 96%   BMI 31.76 kg/m   Physical Exam Vitals and nursing note reviewed.  Constitutional:      General: He is not in acute distress.    Appearance: He is well-developed.  HENT:     Head: Normocephalic and atraumatic.  Eyes:     Conjunctiva/sclera: Conjunctivae normal.  Cardiovascular:     Rate and Rhythm: Normal rate and regular rhythm.     Heart sounds: No murmur heard. Pulmonary:     Effort: Pulmonary effort is normal. No respiratory distress.     Breath sounds: Normal breath sounds.  Abdominal:     Palpations: Abdomen is soft.     Tenderness: There is generalized abdominal tenderness. There is no guarding.  Musculoskeletal:        General: No swelling.     Cervical back: Neck supple.  Skin:    General: Skin is warm and dry.     Capillary Refill: Capillary refill takes less than 2 seconds.  Neurological:     Mental Status: He is alert.  Psychiatric:        Mood and Affect: Mood normal.     (all labs ordered are listed, but only abnormal results are displayed) Labs Reviewed  CBC - Abnormal; Notable for the following components:      Result Value  WBC 15.4 (*)    MCV 79.6 (*)    All other components within normal limits  LIPASE, BLOOD  COMPREHENSIVE METABOLIC PANEL WITH GFR  URINALYSIS, ROUTINE W REFLEX MICROSCOPIC  TROPONIN T, HIGH SENSITIVITY    EKG: None  Radiology: No results found.  {Document cardiac monitor, telemetry assessment procedure when appropriate:32947} Procedures   Medications Ordered in the ED  sodium chloride  0.9 % bolus 1,000 mL (has no administration in time range)  fentaNYL  (SUBLIMAZE ) injection 50 mcg (has no administration in time range)  ondansetron  (ZOFRAN ) injection 4 mg (has no administration in time range)      {Click here for ABCD2, HEART and other calculators REFRESH Note before  signing:1}                              Medical Decision Making Amount and/or Complexity of Data Reviewed Labs: ordered.    59 year old male with medical history significant for HTN, GERD presenting to the emergency department with a chief complaint of nausea, vomiting, diarrhea.  The patient states that he overexerted himself yesterday helping his son move.  Today he had multiple episodes of nonbloody emesis, does describe some bile with dark green material in his emesis earlier today.  He also has had watery stools, denies any blood in his stool.  He has had generalized abdominal cramping-like discomfort, currently described as 5 out of 10 in severity, also earlier today had an episode of chest discomfort described as a heaviness/tightness in his chest.  Presents primarily in the setting of his abdominal pain.  ***  {Document critical care time when appropriate  Document review of labs and clinical decision tools ie CHADS2VASC2, etc  Document your independent review of radiology images and any outside records  Document your discussion with family members, caretakers and with consultants  Document social determinants of health affecting pt's care  Document your decision making why or why not admission, treatments were needed:32947:::1}   Final diagnoses:  None    ED Discharge Orders     None        "

## 2024-08-04 NOTE — ED Triage Notes (Signed)
 Pt presents via POV c/o generalized abd pain with N/V/D starting this am.

## 2024-08-05 LAB — URINALYSIS, ROUTINE W REFLEX MICROSCOPIC
Glucose, UA: NEGATIVE mg/dL
Hgb urine dipstick: NEGATIVE
Ketones, ur: NEGATIVE mg/dL
Leukocytes,Ua: NEGATIVE
Nitrite: NEGATIVE
Protein, ur: 100 mg/dL — AB
Specific Gravity, Urine: 1.02 (ref 1.005–1.030)
pH: 6 (ref 5.0–8.0)

## 2024-08-05 LAB — TROPONIN T, HIGH SENSITIVITY
Troponin T High Sensitivity: <6 ng/L (ref 0–19)
Troponin T High Sensitivity: <6 ng/L (ref 0–19)

## 2024-08-05 LAB — URINALYSIS, MICROSCOPIC (REFLEX)

## 2024-08-05 MED ORDER — LIDOCAINE VISCOUS HCL 2 % MT SOLN
15.0000 mL | Freq: Once | OROMUCOSAL | Status: AC
  Administered 2024-08-05: 15 mL via ORAL
  Filled 2024-08-05: qty 15

## 2024-08-05 MED ORDER — ONDANSETRON 4 MG PO TBDP: 4.0000 mg | ORAL_TABLET | Freq: Three times a day (TID) | ORAL | 0 refills | Status: AC | PRN

## 2024-08-05 MED ORDER — ALUM & MAG HYDROXIDE-SIMETH 200-200-20 MG/5ML PO SUSP
30.0000 mL | Freq: Once | ORAL | Status: AC
  Administered 2024-08-05: 30 mL via ORAL
  Filled 2024-08-05: qty 30

## 2024-08-05 NOTE — ED Notes (Signed)
 ED Provider at bedside.

## 2024-08-05 NOTE — Discharge Instructions (Addendum)
 Your symptoms are consistent with gastroenteritis.  Your laboratory evaluation, EKG and chest x-ray and CT imaging was overall reassuring.  Zofran  has been prescribed to assist with nausea.  Recommend continue fluid resuscitation with electrolyte containing fluids, return for any inability to tolerate oral intake or worsening severe abdominal pain.
